# Patient Record
Sex: Female | Born: 1960 | Race: Black or African American | Hispanic: No | Marital: Married | Smoking: Never smoker
Health system: Southern US, Community
[De-identification: ages and names within clinical notes are randomized; demographics above are authoritative.]

## PROBLEM LIST (undated history)

## (undated) DIAGNOSIS — N920 Excessive and frequent menstruation with regular cycle: Secondary | ICD-10-CM

## (undated) DIAGNOSIS — E559 Vitamin D deficiency, unspecified: Secondary | ICD-10-CM

## (undated) DIAGNOSIS — D649 Anemia, unspecified: Secondary | ICD-10-CM

## (undated) DIAGNOSIS — C189 Malignant neoplasm of colon, unspecified: Secondary | ICD-10-CM

## (undated) DIAGNOSIS — D250 Submucous leiomyoma of uterus: Secondary | ICD-10-CM

## (undated) HISTORY — DX: Anemia, unspecified: D64.9

## (undated) HISTORY — DX: Submucous leiomyoma of uterus: D25.0

## (undated) HISTORY — DX: Excessive and frequent menstruation with regular cycle: N92.0

## (undated) HISTORY — DX: Malignant neoplasm of colon, unspecified: C18.9

## (undated) HISTORY — PX: COLON SURGERY: SHX602

## (undated) HISTORY — DX: Vitamin D deficiency, unspecified: E55.9

---

## 1999-11-07 ENCOUNTER — Other Ambulatory Visit: Admission: RE | Admit: 1999-11-07 | Discharge: 1999-11-07 | Payer: Self-pay | Admitting: Obstetrics and Gynecology

## 1999-11-15 ENCOUNTER — Encounter: Payer: Self-pay | Admitting: Obstetrics and Gynecology

## 1999-11-15 ENCOUNTER — Ambulatory Visit (HOSPITAL_COMMUNITY): Admission: RE | Admit: 1999-11-15 | Discharge: 1999-11-15 | Payer: Self-pay | Admitting: Obstetrics and Gynecology

## 2001-01-22 ENCOUNTER — Other Ambulatory Visit: Admission: RE | Admit: 2001-01-22 | Discharge: 2001-01-22 | Payer: Self-pay | Admitting: Obstetrics and Gynecology

## 2002-04-09 ENCOUNTER — Other Ambulatory Visit: Admission: RE | Admit: 2002-04-09 | Discharge: 2002-04-09 | Payer: Self-pay | Admitting: Obstetrics and Gynecology

## 2002-04-16 ENCOUNTER — Ambulatory Visit (HOSPITAL_COMMUNITY): Admission: RE | Admit: 2002-04-16 | Discharge: 2002-04-16 | Payer: Self-pay | Admitting: Obstetrics and Gynecology

## 2002-04-16 ENCOUNTER — Encounter: Payer: Self-pay | Admitting: Obstetrics and Gynecology

## 2003-04-16 ENCOUNTER — Other Ambulatory Visit: Admission: RE | Admit: 2003-04-16 | Discharge: 2003-04-16 | Payer: Self-pay | Admitting: Obstetrics and Gynecology

## 2003-04-24 ENCOUNTER — Ambulatory Visit (HOSPITAL_COMMUNITY): Admission: RE | Admit: 2003-04-24 | Discharge: 2003-04-24 | Payer: Self-pay | Admitting: Obstetrics and Gynecology

## 2004-10-14 ENCOUNTER — Other Ambulatory Visit: Admission: RE | Admit: 2004-10-14 | Discharge: 2004-10-14 | Payer: Self-pay | Admitting: Obstetrics and Gynecology

## 2004-10-21 ENCOUNTER — Ambulatory Visit (HOSPITAL_COMMUNITY): Admission: RE | Admit: 2004-10-21 | Discharge: 2004-10-21 | Payer: Self-pay | Admitting: Obstetrics and Gynecology

## 2005-11-16 ENCOUNTER — Other Ambulatory Visit: Admission: RE | Admit: 2005-11-16 | Discharge: 2005-11-16 | Payer: Self-pay | Admitting: Obstetrics and Gynecology

## 2005-11-24 ENCOUNTER — Ambulatory Visit (HOSPITAL_COMMUNITY): Admission: RE | Admit: 2005-11-24 | Discharge: 2005-11-24 | Payer: Self-pay | Admitting: Obstetrics and Gynecology

## 2009-01-13 ENCOUNTER — Ambulatory Visit (HOSPITAL_COMMUNITY): Admission: RE | Admit: 2009-01-13 | Discharge: 2009-01-13 | Payer: Self-pay | Admitting: Obstetrics and Gynecology

## 2010-01-20 ENCOUNTER — Ambulatory Visit (HOSPITAL_COMMUNITY): Admission: RE | Admit: 2010-01-20 | Discharge: 2010-01-20 | Payer: Self-pay | Admitting: Obstetrics and Gynecology

## 2011-02-13 ENCOUNTER — Other Ambulatory Visit (HOSPITAL_COMMUNITY): Payer: Self-pay | Admitting: Obstetrics and Gynecology

## 2011-02-13 DIAGNOSIS — Z1231 Encounter for screening mammogram for malignant neoplasm of breast: Secondary | ICD-10-CM

## 2011-03-15 ENCOUNTER — Ambulatory Visit (HOSPITAL_COMMUNITY)
Admission: RE | Admit: 2011-03-15 | Discharge: 2011-03-15 | Disposition: A | Discharge status: Home / Self Care | Payer: Managed Care, Other (non HMO) | Source: Ambulatory Visit | Attending: Obstetrics and Gynecology | Admitting: Obstetrics and Gynecology

## 2011-03-15 DIAGNOSIS — Z1231 Encounter for screening mammogram for malignant neoplasm of breast: Secondary | ICD-10-CM | POA: Insufficient documentation

## 2012-01-17 ENCOUNTER — Ambulatory Visit: Payer: Self-pay | Admitting: Obstetrics and Gynecology

## 2012-01-18 ENCOUNTER — Ambulatory Visit (INDEPENDENT_AMBULATORY_CARE_PROVIDER_SITE_OTHER): Payer: Managed Care, Other (non HMO) | Admitting: Obstetrics and Gynecology

## 2012-01-18 ENCOUNTER — Encounter: Payer: Self-pay | Admitting: Obstetrics and Gynecology

## 2012-01-18 VITALS — BP 114/70 | HR 74 | Ht 62.0 in | Wt 217.0 lb

## 2012-01-18 DIAGNOSIS — Z130 Encounter for screening for diseases of the blood and blood-forming organs and certain disorders involving the immune mechanism: Secondary | ICD-10-CM

## 2012-01-18 DIAGNOSIS — E559 Vitamin D deficiency, unspecified: Secondary | ICD-10-CM

## 2012-01-18 DIAGNOSIS — E785 Hyperlipidemia, unspecified: Secondary | ICD-10-CM | POA: Insufficient documentation

## 2012-01-18 DIAGNOSIS — N920 Excessive and frequent menstruation with regular cycle: Secondary | ICD-10-CM

## 2012-01-18 DIAGNOSIS — D259 Leiomyoma of uterus, unspecified: Secondary | ICD-10-CM

## 2012-01-18 DIAGNOSIS — D649 Anemia, unspecified: Secondary | ICD-10-CM

## 2012-01-18 DIAGNOSIS — Z124 Encounter for screening for malignant neoplasm of cervix: Secondary | ICD-10-CM

## 2012-01-18 DIAGNOSIS — Z01419 Encounter for gynecological examination (general) (routine) without abnormal findings: Secondary | ICD-10-CM

## 2012-01-18 DIAGNOSIS — Z1321 Encounter for screening for nutritional disorder: Secondary | ICD-10-CM

## 2012-01-18 DIAGNOSIS — D219 Benign neoplasm of connective and other soft tissue, unspecified: Secondary | ICD-10-CM

## 2012-01-18 LAB — TSH: TSH: 1.431 u[IU]/mL (ref 0.350–4.500)

## 2012-01-18 LAB — CBC
Hemoglobin: 9.2 g/dL — ABNORMAL LOW (ref 12.0–15.0)
MCH: 22.2 pg — ABNORMAL LOW (ref 26.0–34.0)
RBC: 4.14 MIL/uL (ref 3.87–5.11)

## 2012-01-18 NOTE — Progress Notes (Signed)
Regular Periods: yes Mammogram: yes  Monthly Breast Ex.: yes Exercise: yes  Tetanus < 10 years: no Seatbelts: yes  NI. Bladder Functn.: yes Abuse at home: no  Daily BM's: yes Stressful Work: no  Healthy Diet: yes Sigmoid-Colonoscopy: NO  Calcium: no Medical problems this year: HEAVY BLEEDING DURING CYCLE   LAST PAP:10/12  NL  Contraception: NONE  Mammogram:  12/12  NL  PCP: NONE  PMH: NO CHANGE   FMH: NO CHANGE  Last Bone Scan: NO  PT IS MARRIED

## 2012-01-18 NOTE — Progress Notes (Signed)
Subjective:    Ebony James is a 51 y.o. female, G2P0, who presents for an annual exam. The patient reports heavier periods, craving of ice and fatigue.  Menstrual cycle:   LMP: Patient's last menstrual period was 12/25/2011.  Flow 9 days with pad change every 45 minutes-1 hour with clots; admits to mild cramping; denies intermenstrual bleeding.             Review of Systems Pertinent items are noted in HPI. Denies pelvic pain, urinary tract symptoms, vaginitis symptoms, irregular bleeding, menopausal symptoms, change in bowel habits or rectal bleeding   Objective:    BP 114/70  Pulse 74  Ht 5\' 2"  (1.575 m)  Wt 217 lb (98.431 kg)  BMI 39.69 kg/m2  LMP 12/25/2011   Wt Readings from Last 1 Encounters:  01/18/12 217 lb (98.431 kg)   Body mass index is 39.69 kg/(m^2). General Appearance: Alert, no acute distress HEENT: Grossly normal Neck / Thyroid: Supple, no thyromegaly or cervical adenopathy Lungs: Clear to auscultation bilaterally Back: No CVA tenderness Breast Exam: No masses or nodes.No dimpling, nipple retraction or discharge. Cardiovascular: Regular rate and rhythm.  Gastrointestinal: Soft, non-tender, no masses or organomegaly Pelvic Exam: EGBUS-wnl, vagina-normal rugae, cervix- without lesions or tenderness, uterus appears 12-14 weeks size, adnexae-no masses or tenderness Rectovaginal: deferred due to hemorrhoids Lymphatic Exam: Non-palpable nodes in neck, clavicular,  axillary, or inguinal regions  Skin: no rashes or abnormalities Extremities: no clubbing cyanosis or edema  Neurologic: grossly normal Psychiatric: Alert and oriented    Assessment:   Routine GYN Exam Menorrhagia Uterine Fibroids   Plan:  CBC, TSH, Vitamin D and pelvic ultrasound-pending  PAP sent  RTO 1 year or prn  Esmond Hinch,ELMIRAPA-C

## 2012-01-19 ENCOUNTER — Telehealth: Payer: Self-pay

## 2012-01-19 LAB — PAP IG W/ RFLX HPV ASCU

## 2012-01-19 MED ORDER — VITAMIN D (ERGOCALCIFEROL) 1.25 MG (50000 UNIT) PO CAPS: ORAL_CAPSULE | ORAL | Status: DC

## 2012-01-19 NOTE — Telephone Encounter (Signed)
Message copied by Winfred Leeds on Fri Jan 19, 2012  2:54 PM ------      Message from: Henreitta Leber      Created: Fri Jan 19, 2012  1:20 PM       Patient with low iron and vitamin D needs Vitamin D Protocol and should be advised to take OTC iron supplement, twice a day for 6 weeks.  We should repeat her CBC in 6 weeks to make sure that her iron has normalized.  If patient has problems with constipation, she may take stool softeners with her iron.       POWELL,ELMIRA, PA-C

## 2012-01-19 NOTE — Telephone Encounter (Signed)
TC TO PT REGARDING TEST RESULTS. INFORMED PT THAT HER VIT D LEVEL WAS LOW AND WENT OVER PROTOCOL WITH PT. WILL SEND IN RX TO PT PHARMACY. ALSO INFORMED PT THAT HER IRON LEVEL WAS LOW AND SHE NEEDS TO TAKE A OTC IRON SUPPLEMENT 2 TIMES A DAY FOR 6 WEEKS TO GET IRON LEVEL UP THEN I WILL PUT PT ON RECALL LIST FOR BOTH THE CBC AND VIT D LEVEL TO RECHECK. PT VOICED UNDERSTANDING. TOLD PT TO USE A STOOL SOFTENER WITH  THE IRON TABLETS TO AVOID CONSTIPATION. PT VOICED UNDERSTANDING.

## 2012-01-31 ENCOUNTER — Ambulatory Visit (INDEPENDENT_AMBULATORY_CARE_PROVIDER_SITE_OTHER): Payer: Managed Care, Other (non HMO)

## 2012-01-31 ENCOUNTER — Encounter: Payer: Self-pay | Admitting: Obstetrics and Gynecology

## 2012-01-31 ENCOUNTER — Ambulatory Visit (INDEPENDENT_AMBULATORY_CARE_PROVIDER_SITE_OTHER): Payer: Managed Care, Other (non HMO) | Admitting: Obstetrics and Gynecology

## 2012-01-31 VITALS — BP 118/80 | HR 78 | Wt 218.0 lb

## 2012-01-31 DIAGNOSIS — D649 Anemia, unspecified: Secondary | ICD-10-CM

## 2012-01-31 DIAGNOSIS — D219 Benign neoplasm of connective and other soft tissue, unspecified: Secondary | ICD-10-CM | POA: Insufficient documentation

## 2012-01-31 DIAGNOSIS — D259 Leiomyoma of uterus, unspecified: Secondary | ICD-10-CM

## 2012-01-31 DIAGNOSIS — N920 Excessive and frequent menstruation with regular cycle: Secondary | ICD-10-CM | POA: Insufficient documentation

## 2012-01-31 MED ORDER — TRANEXAMIC ACID 650 MG PO TABS: 1300.0000 mg | ORAL_TABLET | Freq: Three times a day (TID) | ORAL | Status: DC

## 2012-01-31 NOTE — Patient Instructions (Addendum)
Fibroids Fibroids are lumps (tumors) that can occur any place in a woman's body. These lumps are not cancerous. Fibroids vary in size, weight, and where they grow. HOME CARE  Do not take aspirin.  Write down the number of pads or tampons you use during your period. Tell your doctor. This can help determine the best treatment for you. GET HELP RIGHT AWAY IF:  You have pain in your lower belly (abdomen) that is not helped with medicine.  You have cramps that are not helped with medicine.  You have more bleeding between or during your period.  You feel lightheaded or pass out (faint).  Your lower belly pain gets worse. MAKE SURE YOU:  Understand these instructions.  Will watch your condition.  Will get help right away if you are not doing well or get worse. Document Released: 04/29/2010 Document Revised: 06/19/2011 Document Reviewed: 04/29/2010 [facility] Patient Information 2013 [place], [place].   Menorrhagia Dysfunctional uterine bleeding is different from a normal menstrual period. When periods are heavy or there is more bleeding than is usual for you, it is called menorrhagia. It may be caused by hormonal imbalance, or physical, metabolic, or other problems. Examination is necessary in order that your caregiver may treat treatable causes. If this is a continuing problem, a D&C may be needed. That means that the cervix (the opening of the uterus or womb) is dilated (stretched larger) and the lining of the uterus is scraped out. The tissue scraped out is then examined under a microscope by a specialist (pathologist) to make sure there is nothing of concern that needs further or more extensive treatment. HOME CARE INSTRUCTIONS   If medications were prescribed, take exactly as directed. Do not change or switch medications without consulting your caregiver.  Long term heavy bleeding may result in iron deficiency. Your caregiver may have prescribed iron pills. They help replace the iron  your body lost from heavy bleeding. Take exactly as directed. Iron may cause constipation. If this becomes a problem, increase the bran, fruits, and roughage in your diet.  Do not take aspirin or medicines that contain aspirin one week before or during your menstrual period. Aspirin may make the bleeding worse.  If you need to change your sanitary pad or tampon more than once every 2 hours, stay in bed and rest as much as possible until the bleeding stops.  Eat well-balanced meals. Eat foods high in iron. Examples are leafy green vegetables, meat, liver, eggs, and whole grain breads and cereals. Do not try to lose weight until the abnormal bleeding has stopped and your blood iron level is back to normal. SEEK MEDICAL CARE IF:   You need to change your sanitary pad or tampon more than once an hour.  You develop nausea (feeling sick to your stomach) and vomiting, dizziness, or diarrhea while you are taking your medicine.  You have any problems that may be related to the medicine you are taking. SEEK IMMEDIATE MEDICAL CARE IF:   You have a fever.  You develop chills.  You develop severe bleeding or start to pass blood clots.  You feel dizzy or faint. MAKE SURE YOU:   Understand these instructions.  Will watch your condition.  Will get help right away if you are not doing well or get worse. Document Released: 03/27/2005 Document Revised: 06/19/2011 Document Reviewed: 11/15/2007 [facility] Patient Information 2013 [place], [place].  [email or web address]  Options for management: birth control pills/patch/ring;  Mirena IUD, Lysteda,  endometrial ablation, myomectomy, uterine artery  embolization and hysterectomy

## 2012-01-31 NOTE — Progress Notes (Signed)
51 YO with menorrhagia (no cramps)  and anemia, returns for an ultrasound.  Has been taking iron supplements twice a day with Colace.  O: Ultrasound: 10.18 x 7.13 x 7.01 cm, endometrium-7.57 mm;   #4 fibroids: 3.42 x 3.32 x 3.75 cm;  3.47 x 3.69 x 3.87 cm; and 1.99 x 2.92 x 2.56 cm      with normal appearing ovaries  A: Menorrhagia     Anemia (Hgb 9.2)    Uterine Fibroids   P: Reviewed fibroids and management options for menorrhagia: hormonal, Mirena IUD, Lysteda,     endometrial ablation, myomectomy and hysterectomy      Patient wants to try Lysteda 650 mg  #30  2 po tid x 5 days as needed 11 refills      Given information on ablation (Novasure) as patient has some interest in this procedure       RTO-as scheduled or prn   Bexley Laubach, PA-C

## 2012-02-15 ENCOUNTER — Other Ambulatory Visit: Payer: Self-pay | Admitting: Obstetrics and Gynecology

## 2012-02-15 DIAGNOSIS — Z1231 Encounter for screening mammogram for malignant neoplasm of breast: Secondary | ICD-10-CM

## 2012-03-15 ENCOUNTER — Ambulatory Visit (HOSPITAL_COMMUNITY): Payer: Managed Care, Other (non HMO) | Attending: Obstetrics and Gynecology

## 2012-12-16 ENCOUNTER — Ambulatory Visit (INDEPENDENT_AMBULATORY_CARE_PROVIDER_SITE_OTHER): Payer: Managed Care, Other (non HMO) | Admitting: General Surgery

## 2012-12-16 ENCOUNTER — Encounter (INDEPENDENT_AMBULATORY_CARE_PROVIDER_SITE_OTHER): Payer: Self-pay | Admitting: General Surgery

## 2012-12-16 VITALS — BP 112/66 | HR 95 | Temp 97.3°F | Ht 63.0 in | Wt 211.2 lb

## 2012-12-16 DIAGNOSIS — D126 Benign neoplasm of colon, unspecified: Secondary | ICD-10-CM

## 2012-12-16 NOTE — Patient Instructions (Signed)
Plan for laparoscopic assisted right colectomy

## 2012-12-16 NOTE — Progress Notes (Signed)
Patient ID: Ebony James, female   DOB: 12/16/1960, 52 y.o.   MRN: [id number]  Chief Complaint  Patient presents with  . New Evaluation    eval cecal mass    HPI Ebony VEITH is a 52 y.o. female.  We are asked to see the patient in consultation by Dr. Madilyn Fireman to evaluate her for a right colon mass. The patient is a 52 year old black female who recently went for her first routine screening colonoscopy. At that time she was found to have a 4 cm mass in the right colon. This was biopsied and came back as a tubular adenoma. No dysplasia or malignancy was identified. She denied any abdominal pain. She denied any recent weight gain or weight loss. She denied any blood in her stool.  HPI  Past Medical History  Diagnosis Date  . Menorrhagia   . Fibroids, submucosal   . Anemia   . Vitamin D deficiency     H/O    Past Surgical History  Procedure Laterality Date  . Cesarean section  1987    Family History  Problem Relation Age of Onset  . Cancer Mother     CERVICAL  . Cancer Sister     BREAST    Social History History  Substance Use Topics  . Smoking status: Never Smoker   . Smokeless tobacco: Never Used  . Alcohol Use: Yes     Comment: occassional    No Known Allergies  Current Outpatient Prescriptions  Medication Sig Dispense Refill  . Iron-Vit C-Vit B12-Folic Acid (IRON 100 PLUS PO) Take by mouth.      . tranexamic acid (LYSTEDA) 650 MG TABS Take 2 tablets (1,300 mg total) by mouth 3 (three) times daily.  30 tablet  11  . Vitamin D, Ergocalciferol, (DRISDOL) 50000 UNITS CAPS TAKE ONE CAPSULE 1 TIME A WEEK FOR 12 WEEKS  20 capsule  0   No current facility-administered medications for this visit.    Review of Systems Review of Systems  Constitutional: Negative.   HENT: Negative.   Eyes: Negative.   Respiratory: Negative.   Cardiovascular: Negative.   Gastrointestinal: Negative.   Endocrine: Negative.   Genitourinary: Negative.   Musculoskeletal: Negative.    Skin: Negative.   Allergic/Immunologic: Negative.   Neurological: Negative.   Hematological: Negative.   Psychiatric/Behavioral: Negative.     Blood pressure 112/66, pulse 95, temperature 97.3 F (36.3 C), temperature source Temporal, height 5\' 3"  (1.6 m), weight 211 lb 3.2 oz (95.8 kg), SpO2 98.00%.  Physical Exam Physical Exam  Constitutional: She is oriented to person, place, and time. She appears well-developed and well-nourished.  HENT:  Head: Normocephalic and atraumatic.  Eyes: Conjunctivae and EOM are normal. Pupils are equal, round, and reactive to light.  Neck: Normal range of motion. Neck supple.  Cardiovascular: Normal rate, regular rhythm and normal heart sounds.   Pulmonary/Chest: Effort normal and breath sounds normal.  Abdominal: Soft. Bowel sounds are normal. She exhibits no mass. There is no tenderness.  Musculoskeletal: Normal range of motion.  Neurological: She is alert and oriented to person, place, and time.  Skin: Skin is warm and dry.  Psychiatric: She has a normal mood and affect. Her behavior is normal.    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 Unfortunately if this is left alone there is some risk that he  could turn into a colon cancer. Because of this the recommendation would be to have this segment of her colon removed. I think she would be a good candidate for a laparoscopic-assisted type of case. I have discussed with her in detail the risks and benefits of the operation to remove this segment of colon as well as some of the technical aspects including the risk of leak and she understands and wishes to proceed. She does seem a little bit surprised  About the need for surgery and would like to talk to her family before scheduling.     Plan    Plan for laparoscopic-assisted right colectomy. I will be happy to talk to her and  her family again if they have any other questions.        TOTH III,Carri Spillers S 12/16/2012, 2:11 PM

## 2012-12-24 ENCOUNTER — Encounter (INDEPENDENT_AMBULATORY_CARE_PROVIDER_SITE_OTHER): Payer: Self-pay

## 2012-12-26 ENCOUNTER — Other Ambulatory Visit (INDEPENDENT_AMBULATORY_CARE_PROVIDER_SITE_OTHER): Payer: Self-pay | Admitting: General Surgery

## 2013-01-20 ENCOUNTER — Encounter (HOSPITAL_COMMUNITY): Payer: Self-pay | Admitting: Pharmacy Technician

## 2013-01-22 NOTE — Pre-Procedure Instructions (Signed)
Ebony James  01/22/2013   Your procedure is scheduled on:  Friday, October 24th  Report to Main Entrance "A" and check in with admitting at 0530 AM.  Call this number if you have problems the morning of surgery: [phone]   Remember:   Do not eat food or drink liquids after midnight.   Take these medicines the morning of surgery with A SIP OF WATER: none   Do not wear jewelry, make-up or nail polish.  Do not wear lotions, powders, or perfumes. You may wear deodorant.  Do not shave 48 hours prior to surgery. Men may shave face and neck.  Do not bring valuables to the hospital.  [facility] is not responsible  for any belongings or valuables.               Contacts, dentures or bridgework may not be worn into surgery.  Leave suitcase in the car. After surgery it may be brought to your room.  For patients admitted to the hospital, discharge time is determined by your treatment team.               Patients discharged the day of surgery will not be allowed to drive home.   Special Instructions: Shower using CHG 2 nights before surgery and the night before surgery.  If you shower the day of surgery use CHG.  Use special wash - you have one bottle of CHG for all showers.  You should use approximately 1/3 of the bottle for each shower.   Please read over the following fact sheets that you were given: Pain Booklet, Coughing and Deep Breathing, Blood Transfusion Information and Surgical Site Infection Prevention

## 2013-01-23 ENCOUNTER — Encounter (HOSPITAL_COMMUNITY)
Admission: RE | Admit: 2013-01-23 | Discharge: 2013-01-23 | Disposition: A | Discharge status: Home / Self Care | Payer: Managed Care, Other (non HMO) | Source: Ambulatory Visit | Attending: General Surgery | Admitting: General Surgery

## 2013-01-23 ENCOUNTER — Telehealth (INDEPENDENT_AMBULATORY_CARE_PROVIDER_SITE_OTHER): Payer: Self-pay

## 2013-01-23 DIAGNOSIS — Z01812 Encounter for preprocedural laboratory examination: Secondary | ICD-10-CM | POA: Insufficient documentation

## 2013-01-23 LAB — BASIC METABOLIC PANEL
BUN: 14 mg/dL (ref 6–23)
Chloride: 105 mEq/L (ref 96–112)
GFR calc Af Amer: >90 mL/min (ref >90)
Potassium: 3.7 mEq/L (ref 3.5–5.1)
Sodium: 140 mEq/L (ref 135–145)

## 2013-01-23 LAB — CBC
Hemoglobin: 8.6 g/dL — ABNORMAL LOW (ref 12.0–15.0)
MCH: 22.3 pg — ABNORMAL LOW (ref 26.0–34.0)
MCHC: 29.5 g/dL — ABNORMAL LOW (ref 30.0–36.0)
MCV: 75.6 fL — ABNORMAL LOW (ref 78.0–100.0)
Platelets: 365 10*3/uL (ref 150–400)
RBC: 3.86 MIL/uL — ABNORMAL LOW (ref 3.87–5.11)
RDW: 18.6 % — ABNORMAL HIGH (ref 11.5–15.5)
WBC: 6.6 10*3/uL (ref 4.0–10.5)

## 2013-01-23 LAB — ABO/RH: ABO/RH(D): B POS

## 2013-01-23 NOTE — Progress Notes (Signed)
Anesthesia Chart Review:  Patient is a 52 year old female scheduled for laparoscopic assisted right colectomy for a large 4 cm adenoma in the cecum on 01/31/13 by Dr. Carolynne Edouard. History includes obesity, non-smoker, fibroids, anemia, vitamin D deficiency. c-section.  PCP is listed as Dr. Alesia Richards Nnodi. GI is Dr. Madilyn Fireman.   Preoperative labs noted.  CBC results routed to Dr. Carolynne Edouard.  H/H 8.6/29.2.  (Currently, the only comparison labs are from 01/18/12 that show an H/H of 9.2/32.4.  She has a known history of anemia. PCP records are pending.)  PLT 365K.  T&S was done.  I'll change it to a T&C for 2 Units of PRBC to have available if needed.     Velna [facility] Short Stay Center/Anesthesiology Phone [phone] 01/23/2013 5:30 PM

## 2013-01-23 NOTE — Telephone Encounter (Signed)
Pt came in and given prep and instructions.

## 2013-01-23 NOTE — Telephone Encounter (Signed)
[name] at short stay called stating pt has not received one day bowel prep that is in Dr Billey Chang surgical orders. I advised her to have pt come to office today for rx and prep instructions. Marcelino Duster aware pt will be coming.

## 2013-01-27 ENCOUNTER — Telehealth (INDEPENDENT_AMBULATORY_CARE_PROVIDER_SITE_OTHER): Payer: Self-pay

## 2013-01-27 NOTE — Telephone Encounter (Signed)
Patient was unsure of colon prep. I ask if she received any information on colon prep with the Gatorade, mirilax and Doculax , patient replied yes I advised her to pick it up at her RX and mix and drink  as directed if she had any questions to call. Patient verbalized understanding

## 2013-01-30 MED ORDER — DEXTROSE 5 % IV SOLN
2.0000 g | INTRAVENOUS | Status: AC
  Administered 2013-01-31: 2 g via INTRAVENOUS
  Filled 2013-01-30: qty 2

## 2013-01-31 ENCOUNTER — Inpatient Hospital Stay (HOSPITAL_COMMUNITY): Payer: Managed Care, Other (non HMO) | Admitting: Anesthesiology

## 2013-01-31 ENCOUNTER — Inpatient Hospital Stay (HOSPITAL_COMMUNITY)
Admission: RE | Admit: 2013-01-31 | Discharge: 2013-02-04 | DRG: 331 | Disposition: A | Discharge status: Home / Self Care | Payer: Managed Care, Other (non HMO) | Source: Ambulatory Visit | Attending: General Surgery | Admitting: General Surgery

## 2013-01-31 ENCOUNTER — Encounter (HOSPITAL_COMMUNITY)
Admission: RE | Disposition: A | Discharge status: Home / Self Care | Payer: Self-pay | Source: Ambulatory Visit | Attending: General Surgery

## 2013-01-31 ENCOUNTER — Encounter (HOSPITAL_COMMUNITY): Payer: Managed Care, Other (non HMO) | Admitting: Vascular Surgery

## 2013-01-31 ENCOUNTER — Encounter (HOSPITAL_COMMUNITY): Payer: Self-pay | Admitting: Anesthesiology

## 2013-01-31 DIAGNOSIS — Z23 Encounter for immunization: Secondary | ICD-10-CM

## 2013-01-31 DIAGNOSIS — E669 Obesity, unspecified: Secondary | ICD-10-CM | POA: Diagnosis present

## 2013-01-31 DIAGNOSIS — C189 Malignant neoplasm of colon, unspecified: Secondary | ICD-10-CM

## 2013-01-31 DIAGNOSIS — D126 Benign neoplasm of colon, unspecified: Principal | ICD-10-CM | POA: Diagnosis present

## 2013-01-31 HISTORY — PX: LAPAROSCOPIC RIGHT COLECTOMY: SHX5925

## 2013-01-31 SURGERY — COLECTOMY, RIGHT, LAPAROSCOPIC
Anesthesia: General | Site: Abdomen | Laterality: Right | Wound class: Clean Contaminated

## 2013-01-31 MED ORDER — ALVIMOPAN 12 MG PO CAPS
12.0000 mg | ORAL_CAPSULE | Freq: Once | ORAL | Status: AC
  Administered 2013-01-31: 12 mg via ORAL
  Filled 2013-01-31: qty 1

## 2013-01-31 MED ORDER — CHLORHEXIDINE GLUCONATE 4 % EX LIQD: 1.0000 "application " | Freq: Once | CUTANEOUS | Status: DC

## 2013-01-31 MED ORDER — PANTOPRAZOLE SODIUM 40 MG IV SOLR
40.0000 mg | INTRAVENOUS | Status: DC
  Administered 2013-01-31 – 2013-02-03 (×4): 40 mg via INTRAVENOUS
  Filled 2013-01-31 (×6): qty 40

## 2013-01-31 MED ORDER — POVIDONE-IODINE 10 % EX OINT
TOPICAL_OINTMENT | CUTANEOUS | Status: DC | PRN
  Administered 2013-01-31: 1 via TOPICAL

## 2013-01-31 MED ORDER — HYDROMORPHONE HCL PF 1 MG/ML IJ SOLN
INTRAMUSCULAR | Status: AC
  Filled 2013-01-31: qty 1

## 2013-01-31 MED ORDER — HEPARIN SODIUM (PORCINE) 5000 UNIT/ML IJ SOLN
5000.0000 [IU] | Freq: Three times a day (TID) | INTRAMUSCULAR | Status: DC
  Administered 2013-02-01 – 2013-02-04 (×10): 5000 [IU] via SUBCUTANEOUS
  Filled 2013-01-31 (×14): qty 1

## 2013-01-31 MED ORDER — ROCURONIUM BROMIDE 100 MG/10ML IV SOLN
INTRAVENOUS | Status: DC | PRN
  Administered 2013-01-31: 50 mg via INTRAVENOUS
  Administered 2013-01-31: 10 mg via INTRAVENOUS
  Administered 2013-01-31: 20 mg via INTRAVENOUS

## 2013-01-31 MED ORDER — BUPIVACAINE-EPINEPHRINE PF 0.25-1:200000 % IJ SOLN
INTRAMUSCULAR | Status: AC
  Filled 2013-01-31: qty 30

## 2013-01-31 MED ORDER — OXYCODONE HCL 5 MG PO TABS: 5.0000 mg | ORAL_TABLET | Freq: Once | ORAL | Status: AC | PRN

## 2013-01-31 MED ORDER — SODIUM CHLORIDE 0.9 % IR SOLN
Status: DC | PRN
  Administered 2013-01-31: 1000 mL
  Administered 2013-01-31: 3000 mL

## 2013-01-31 MED ORDER — OXYCODONE HCL 5 MG/5ML PO SOLN
5.0000 mg | Freq: Once | ORAL | Status: AC | PRN
  Administered 2013-01-31: 5 mg via ORAL

## 2013-01-31 MED ORDER — ONDANSETRON HCL 4 MG/2ML IJ SOLN: 4.0000 mg | Freq: Once | INTRAMUSCULAR | Status: DC | PRN

## 2013-01-31 MED ORDER — OXYCODONE HCL 5 MG/5ML PO SOLN
ORAL | Status: AC
  Filled 2013-01-31: qty 5

## 2013-01-31 MED ORDER — POVIDONE-IODINE 10 % EX OINT
TOPICAL_OINTMENT | CUTANEOUS | Status: AC
  Filled 2013-01-31: qty 28.35

## 2013-01-31 MED ORDER — PROPOFOL 10 MG/ML IV BOLUS
INTRAVENOUS | Status: DC | PRN
  Administered 2013-01-31: 150 mg via INTRAVENOUS
  Administered 2013-01-31: 50 mg via INTRAVENOUS

## 2013-01-31 MED ORDER — FENTANYL CITRATE 0.05 MG/ML IJ SOLN
INTRAMUSCULAR | Status: DC | PRN
  Administered 2013-01-31 (×2): 100 ug via INTRAVENOUS
  Administered 2013-01-31: 50 ug via INTRAVENOUS
  Administered 2013-01-31 (×2): 100 ug via INTRAVENOUS
  Administered 2013-01-31: 50 ug via INTRAVENOUS

## 2013-01-31 MED ORDER — HYDROMORPHONE HCL PF 1 MG/ML IJ SOLN
0.2500 mg | INTRAMUSCULAR | Status: DC | PRN
  Administered 2013-01-31 (×3): 0.5 mg via INTRAVENOUS

## 2013-01-31 MED ORDER — NEOSTIGMINE METHYLSULFATE 1 MG/ML IJ SOLN
INTRAMUSCULAR | Status: DC | PRN
  Administered 2013-01-31: 5 mg via INTRAVENOUS

## 2013-01-31 MED ORDER — KCL IN DEXTROSE-NACL 20-5-0.9 MEQ/L-%-% IV SOLN
INTRAVENOUS | Status: DC
  Administered 2013-01-31 – 2013-02-02 (×4): via INTRAVENOUS
  Filled 2013-01-31 (×7): qty 1000

## 2013-01-31 MED ORDER — MIDAZOLAM HCL 5 MG/5ML IJ SOLN
INTRAMUSCULAR | Status: DC | PRN
  Administered 2013-01-31: 2 mg via INTRAVENOUS

## 2013-01-31 MED ORDER — ONDANSETRON HCL 4 MG PO TABS: 4.0000 mg | ORAL_TABLET | Freq: Four times a day (QID) | ORAL | Status: DC | PRN

## 2013-01-31 MED ORDER — BUPIVACAINE-EPINEPHRINE 0.25% -1:200000 IJ SOLN
INTRAMUSCULAR | Status: DC | PRN
  Administered 2013-01-31: 20 mL

## 2013-01-31 MED ORDER — MEPERIDINE HCL 25 MG/ML IJ SOLN: 6.2500 mg | INTRAMUSCULAR | Status: DC | PRN

## 2013-01-31 MED ORDER — ONDANSETRON HCL 4 MG/2ML IJ SOLN
4.0000 mg | Freq: Four times a day (QID) | INTRAMUSCULAR | Status: DC | PRN
  Administered 2013-01-31: 4 mg via INTRAVENOUS
  Filled 2013-01-31: qty 2

## 2013-01-31 MED ORDER — INFLUENZA VAC SPLIT QUAD 0.5 ML IM SUSP
0.5000 mL | INTRAMUSCULAR | Status: AC
  Administered 2013-02-01: 0.5 mL via INTRAMUSCULAR
  Filled 2013-01-31: qty 0.5

## 2013-01-31 MED ORDER — MORPHINE SULFATE 4 MG/ML IJ SOLN
4.0000 mg | INTRAMUSCULAR | Status: DC | PRN
Start: 2013-01-31 — End: 2013-02-04
  Administered 2013-01-31 – 2013-02-02 (×15): 4 mg via INTRAVENOUS
  Administered 2013-02-02: 2 mg via INTRAVENOUS
  Administered 2013-02-02: 4 mg via INTRAVENOUS
  Administered 2013-02-02: 2 mg via INTRAVENOUS
  Administered 2013-02-03: 4 mg via INTRAVENOUS
  Filled 2013-01-31 (×19): qty 1

## 2013-01-31 MED ORDER — LIDOCAINE HCL (CARDIAC) 20 MG/ML IV SOLN
INTRAVENOUS | Status: DC | PRN
  Administered 2013-01-31: 100 mg via INTRAVENOUS

## 2013-01-31 MED ORDER — LACTATED RINGERS IV SOLN
INTRAVENOUS | Status: DC | PRN
  Administered 2013-01-31 (×3): via INTRAVENOUS

## 2013-01-31 MED ORDER — ALVIMOPAN 12 MG PO CAPS
12.0000 mg | ORAL_CAPSULE | Freq: Two times a day (BID) | ORAL | Status: DC
  Administered 2013-02-01 – 2013-02-04 (×7): 12 mg via ORAL
  Filled 2013-01-31 (×11): qty 1

## 2013-01-31 MED ORDER — GLYCOPYRROLATE 0.2 MG/ML IJ SOLN
INTRAMUSCULAR | Status: DC | PRN
  Administered 2013-01-31: .5 mg via INTRAVENOUS

## 2013-01-31 SURGICAL SUPPLY — 75 items
ADH SKN CLS APL DERMABOND .7 (GAUZE/BANDAGES/DRESSINGS)
APPLIER CLIP ROT 10 11.4 M/L (STAPLE)
APR CLP MED LRG 11.4X10 (STAPLE)
BLADE SURG 10 STRL SS (BLADE) ×2 IMPLANT
BLADE SURG ROTATE 9660 (MISCELLANEOUS) IMPLANT
CANISTER SUCTION 2500CC (MISCELLANEOUS) ×2 IMPLANT
CELLS DAT CNTRL 66122 CELL SVR (MISCELLANEOUS) ×1 IMPLANT
CHLORAPREP W/TINT 26ML (MISCELLANEOUS) ×2 IMPLANT
CLIP APPLIE ROT 10 11.4 M/L (STAPLE) IMPLANT
COVER SURGICAL LIGHT HANDLE (MISCELLANEOUS) ×3 IMPLANT
DECANTER SPIKE VIAL GLASS SM (MISCELLANEOUS) ×2 IMPLANT
DERMABOND ADVANCED (GAUZE/BANDAGES/DRESSINGS)
DERMABOND ADVANCED .7 DNX12 (GAUZE/BANDAGES/DRESSINGS) IMPLANT
DRAPE PROXIMA HALF (DRAPES) ×2 IMPLANT
DRAPE UTILITY 15X26 W/TAPE STR (DRAPE) ×4 IMPLANT
DRAPE WARM FLUID 44X44 (DRAPE) ×2 IMPLANT
DRSG OPSITE POSTOP 4X8 (GAUZE/BANDAGES/DRESSINGS) ×1 IMPLANT
ELECT CAUTERY BLADE 6.4 (BLADE) ×4 IMPLANT
ELECT REM PT RETURN 9FT ADLT (ELECTROSURGICAL) ×2
ELECTRODE REM PT RTRN 9FT ADLT (ELECTROSURGICAL) ×1 IMPLANT
GAUZE SPONGE 2X2 8PLY STRL LF (GAUZE/BANDAGES/DRESSINGS) IMPLANT
GLOVE BIO SURGEON STRL SZ 6.5 (GLOVE) ×4 IMPLANT
GLOVE BIO SURGEON STRL SZ7.5 (GLOVE) ×5 IMPLANT
GLOVE BIOGEL PI IND STRL 6.5 (GLOVE) IMPLANT
GLOVE BIOGEL PI IND STRL 7.5 (GLOVE) ×3 IMPLANT
GLOVE BIOGEL PI IND STRL 8 (GLOVE) ×2 IMPLANT
GLOVE BIOGEL PI INDICATOR 6.5 (GLOVE) ×4
GLOVE BIOGEL PI INDICATOR 7.5 (GLOVE) ×3
GLOVE BIOGEL PI INDICATOR 8 (GLOVE) ×2
GLOVE ECLIPSE 6.0 STRL STRAW (GLOVE) ×4 IMPLANT
GOWN STRL NON-REIN LRG LVL3 (GOWN DISPOSABLE) ×11 IMPLANT
GOWN STRL REIN XL XLG (GOWN DISPOSABLE) ×2 IMPLANT
KIT BASIN OR (CUSTOM PROCEDURE TRAY) ×2 IMPLANT
KIT ROOM TURNOVER OR (KITS) ×2 IMPLANT
LIGASURE IMPACT 36 18CM CVD LR (INSTRUMENTS) ×2 IMPLANT
NS IRRIG 1000ML POUR BTL (IV SOLUTION) ×4 IMPLANT
PAD ARMBOARD 7.5X6 YLW CONV (MISCELLANEOUS) ×4 IMPLANT
PENCIL BUTTON HOLSTER BLD 10FT (ELECTRODE) ×2 IMPLANT
RELOAD PROXIMATE 75MM BLUE (ENDOMECHANICALS) ×2 IMPLANT
RELOAD STAPLE 75 3.8 BLU REG (ENDOMECHANICALS) IMPLANT
RTRCTR WOUND ALEXIS 18CM MED (MISCELLANEOUS) ×2
SCALPEL HARMONIC ACE (MISCELLANEOUS) IMPLANT
SCISSORS LAP 5X35 DISP (ENDOMECHANICALS) IMPLANT
SET IRRIG TUBING LAPAROSCOPIC (IRRIGATION / IRRIGATOR) ×2 IMPLANT
SLEEVE ENDOPATH XCEL 5M (ENDOMECHANICALS) ×6 IMPLANT
SPECIMEN JAR LARGE (MISCELLANEOUS) ×2 IMPLANT
SPONGE GAUZE 2X2 STER 10/PKG (GAUZE/BANDAGES/DRESSINGS) ×1
SPONGE GAUZE 4X4 12PLY (GAUZE/BANDAGES/DRESSINGS) ×2 IMPLANT
STAPLER GUN LINEAR PROX 60 (STAPLE) ×2 IMPLANT
STAPLER PROXIMATE 75MM BLUE (STAPLE) ×1 IMPLANT
STAPLER VISISTAT 35W (STAPLE) ×2 IMPLANT
SUCTION POOLE TIP (SUCTIONS) ×1 IMPLANT
SUT PDS AB 1 CT  36 (SUTURE) ×2
SUT PDS AB 1 CT 36 (SUTURE) ×2 IMPLANT
SUT PDS AB 1 TP1 96 (SUTURE) ×2 IMPLANT
SUT SILK 2 0 (SUTURE) ×2
SUT SILK 2 0 SH CR/8 (SUTURE) ×3 IMPLANT
SUT SILK 2-0 18XBRD TIE 12 (SUTURE) ×1 IMPLANT
SUT SILK 3 0 (SUTURE) ×2
SUT SILK 3 0 SH CR/8 (SUTURE) ×2 IMPLANT
SUT SILK 3-0 18XBRD TIE 12 (SUTURE) ×1 IMPLANT
SYR BULB IRRIGATION 50ML (SYRINGE) ×1 IMPLANT
SYS LAPSCP GELPORT 120MM (MISCELLANEOUS)
SYSTEM LAPSCP GELPORT 120MM (MISCELLANEOUS) IMPLANT
TOWEL OR 17X24 6PK STRL BLUE (TOWEL DISPOSABLE) ×2 IMPLANT
TOWEL OR 17X26 10 PK STRL BLUE (TOWEL DISPOSABLE) ×2 IMPLANT
TRAY FOLEY CATH 14FRSI W/METER (CATHETERS) ×2 IMPLANT
TRAY LAPAROSCOPIC (CUSTOM PROCEDURE TRAY) ×2 IMPLANT
TROCAR XCEL 12X100 BLDLESS (ENDOMECHANICALS) IMPLANT
TROCAR XCEL BLUNT TIP 100MML (ENDOMECHANICALS) ×2 IMPLANT
TROCAR XCEL NON-BLD 11X100MML (ENDOMECHANICALS) IMPLANT
TROCAR XCEL NON-BLD 5MMX100MML (ENDOMECHANICALS) ×2 IMPLANT
TUBE CONNECTING 12X1/4 (SUCTIONS) ×2 IMPLANT
WATER STERILE IRR 1000ML POUR (IV SOLUTION) IMPLANT
YANKAUER SUCT BULB TIP NO VENT (SUCTIONS) ×4 IMPLANT

## 2013-01-31 NOTE — Op Note (Signed)
01/31/2013  9:47 AM  PATIENT:  Ebony James  52 y.o. female  PRE-OPERATIVE DIAGNOSIS:  right colon polyp  POST-OPERATIVE DIAGNOSIS:  right colon polyp  PROCEDURE:  Procedure(s): LAPAROSCOPIC ASSISTED RIGHT COLECTOMY (Right)  SURGEON:  Surgeon(s) and Role:    * Robyne Askew, MD - Primary  PHYSICIAN ASSISTANT:   ASSISTANTS: Dr. Michaell Cowing   ANESTHESIA:   general  EBL:  Total I/O In: 2000 [I.V.:2000] Out: 200 [Urine:200]  BLOOD ADMINISTERED:none  DRAINS: none   LOCAL MEDICATIONS USED:  MARCAINE     SPECIMEN:  Source of Specimen:  right colon and terminal ileum  DISPOSITION OF SPECIMEN:  PATHOLOGY  COUNTS:  YES  TOURNIQUET:  * No tourniquets in log *  DICTATION: .Dragon Dictation After informed consent was obtained the patient was brought to the operating room and placed in the supine position on the operating room table. After adequate induction of general anesthesia the patient's abdomen was prepped with ChloraPrep, wound dry, and draped in usual sterile manner. A site was chosen in the left upper quadrant for placement of a 5 mm port. This area was infiltrated with quarter percent Marcaine. A small stab incision was made with a 15 blade knife. A 5 mm Optiview port was used to bluntly dissect through the layers of the abdominal wall under direct vision until access was gained to the abdominal cavity. The ends and insufflated with carbon dioxide without difficulty. The laparoscope was placed through the 5 mm port and the abdomen was inspected. No obvious abnormalities were noted. 2 other 5 mm ports were placed under direct vision on the left side of the abdomen. Using a Glassman grasper and harmonic scalpel the right colon was identified. The right colon was mobilized by incising its retroperitoneal attachment on the white line of Toldt with the harmonic scalpel. The transverse colon was also mobilized off of the omentum and away from the liver and duodenum. Care was taken to  stay away from the duodenum. Once this was accomplished the right colon appeared to be very mobile. At this point we made a small supraumbilical midline incision with a 10 blade knife. This incision was carried through the skin and subcutaneous tissue sharply with electrocautery until the linea alba was identified. The linea alba also incised with the electrocautery. The preperitoneal space was probed with the hemostat until the peritoneum was opened next this was into the abdominal cavity. The rest of the incision was opened under direct vision. A wound protector was applied. The right colon was grasped with a Babcock and able to be brought up into the wound. A site was chosen on the transverse colon for division of the colon. The mesentery at this point was opened sharply with the electrocautery. A GI 75 stapler was placed across the colon at this point clamped and fired thereby dividing the colon between staple lines. A site was also chosen on the terminal ileum to divide the small bowel. The mesentery this point was opened sharply with electrocautery. A GIA 75 stapler was placed across the small bowel this point clamped and fired dividing the small bowel between staple lines. The mesentery to the right colon was taken down via combination of sharp dissection with the harmonic scalpel. The major vessels and the right colon mesentery were then clamped with Kelly clamps, divided, and ligated with 2-0 silk suture ligatures. Once this was accomplished the terminal ileum and right colon was removed. The polyp in question was easily palpated in the  cecum. The specimen was sent to pathology for further evaluation. The distal small bowel and proximal colon easily approximated each other. A small opening was made on the antimesenteric border of each limb of the small bowel and colon. Each limb of the GIA 75 stapler was then placed down the appropriate limb of small bowel or colon, clamped, and fired thereby creating a  nice widely patent enteroenterostomy. The common opening was closed with a TA 60 stapler. A 2-0 silk crotch stitch was also placed. The staple line was reinforced with 2-0 silk Lembert stitches. Once was accomplished and the anastomosis looked very good and healthy and was widely open. The mesenteric defect was closed with interrupted 2-0 silk stitches. The anastomosis was then placed back in the abdomen without difficulty. The abdomen was irrigated with copious amounts of saline. The fascia of the anterior abdominal wall was then closed with 2 running #1 double-stranded looped PDS sutures. The subcutaneous tissue was irrigated with copious amounts of saline. The skin incisions were closed with staples and sterile dressings were applied. The patient tolerated the procedure well. At the end of the case all needle sponge and instrument counts were correct. The patient was then awakened and taken to recovery in stable condition.  PLAN OF CARE: Admit to inpatient   PATIENT DISPOSITION:  PACU - hemodynamically stable.   Delay start of Pharmacological VTE agent (>24hrs) due to surgical blood loss or risk of bleeding: no

## 2013-01-31 NOTE — Transfer of Care (Signed)
Immediate Anesthesia Transfer of Care Note  Patient: Ebony James  Procedure(s) Performed: Procedure(s): LAPAROSCOPIC ASSISTED RIGHT COLECTOMY (Right)  Patient Location: PACU  Anesthesia Type:General  Level of Consciousness: awake and alert   Airway & Oxygen Therapy: Patient Spontanous Breathing and Patient connected to face mask oxygen  Post-op Assessment: Report given to PACU RN and Post -op Vital signs reviewed and stable  Post vital signs: Reviewed and stable  Complications: No apparent anesthesia complications

## 2013-01-31 NOTE — Anesthesia Procedure Notes (Signed)
Procedure Name: Intubation Date/Time: 01/31/2013 7:40 AM Performed by: Caren Macadam Pre-anesthesia Checklist: Patient identified, Emergency Drugs available, Suction available and Patient being monitored Patient Re-evaluated:Patient Re-evaluated prior to inductionOxygen Delivery Method: Circle System Utilized Preoxygenation: Pre-oxygenation with 100% oxygen Intubation Type: IV induction Ventilation: Mask ventilation without difficulty Laryngoscope Size: Miller, 2, Mac and 3 Grade View: Grade II Tube type: Oral Number of attempts: 1 Airway Equipment and Method: stylet and oral airway (Glidescope) Placement Confirmation: ETT inserted through vocal cords under direct vision,  positive ETCO2 and breath sounds checked- equal and bilateral Secured at: 21 cm Tube secured with: Tape Dental Injury: Injury to lip  Difficulty Due To: Difficulty was unanticipated and Difficult Airway- due to anterior larynx Future Recommendations: Recommend- induction with short-acting agent, and alternative techniques readily available Comments: Pt induced, Gladys Damme, CRNA DL X 2 with Hyacinth Meeker 2, good view of arytenoids, unable to view cords.  Sats 96-100%. KO DL X 1 with mac 3, ett into esop. recognized immediately. Sats 95-100%. Used glidescope to get view of cords, ett placed thru cords, +etco2, =bbs. Small lac to left lip noted.

## 2013-01-31 NOTE — H&P (Signed)
Ebony James  12/16/2012 1:50 PM   Office Visit  MRN:  [id number]   Description: 52 year old female  Provider: Robyne Askew, MD  Department: [facility]        Diagnoses    Tubular adenoma of colon    -  Primary    211.3      Reason for Visit    New Evaluation    eval cecal mass        Current Vitals - Last Recorded    BP Pulse Temp(Src) Ht Wt BMI    112/66 95 97.3 F (36.3 C) (Temporal) 5\' 3"  (1.6 m) 211 lb 3.2 oz (95.8 kg) 37.42 kg/m2       SpO2              98%                 Progress Notes    Robyne Askew, MD at 12/16/2012  2:16 PM    Status: Signed                   Patient ID: Ebony James, female   DOB: 1960/05/17, 52 y.o.   MRN: [id number]    Chief Complaint   Patient presents with   .  New Evaluation       eval cecal mass        Ebony James is a 52 y.o. female.  We are asked to see the patient in consultation by Dr. Madilyn Fireman to evaluate her for a right colon mass. The patient is a 52 year old black female who recently went for her first routine screening colonoscopy. At that time she was found to have a 4 cm mass in the right colon. This was biopsied and came back as a tubular adenoma. No dysplasia or malignancy was identified. She denied any abdominal pain. She denied any recent weight gain or weight loss. She denied any blood in her stool.  Ebony    Past Medical History   Diagnosis  Date   .  Menorrhagia     .  Fibroids, submucosal     .  Anemia     .  Vitamin D deficiency         H/O         Past Surgical History   Procedure  Laterality  Date   .  Cesarean section    1987         Family History   Problem  Relation  Age of Onset   .  Cancer  Mother         CERVICAL   .  Cancer  Sister         BREAST        Social History History   Substance Use Topics   .  Smoking status:  Never Smoker    .  Smokeless tobacco:  Never Used   .  Alcohol Use:  Yes         Comment: occassional        No Known  Allergies    Current Outpatient Prescriptions   Medication  Sig  Dispense  Refill   .  Iron-Vit C-Vit B12-Folic Acid (IRON 100 PLUS PO)  Take by mouth.         .  tranexamic acid (LYSTEDA) 650 MG TABS  Take 2 tablets (1,300 mg total) by mouth 3 (three) times daily.   30  tablet   11   .  Vitamin D, Ergocalciferol, (DRISDOL) 50000 UNITS CAPS  TAKE ONE CAPSULE 1 TIME A WEEK FOR 12 WEEKS   20 capsule   0       No current facility-administered medications for this visit.        Review of Systems Review of Systems  Constitutional: Negative.   HENT: Negative.   Eyes: Negative.   Respiratory: Negative.   Cardiovascular: Negative.   Gastrointestinal: Negative.   Endocrine: Negative.   Genitourinary: Negative.   Musculoskeletal: Negative.   Skin: Negative.   Allergic/Immunologic: Negative.   Neurological: Negative.   Hematological: Negative.   Psychiatric/Behavioral: Negative.       Blood pressure 112/66, pulse 95, temperature 97.3 F (36.3 C), temperature source Temporal, height 5\' 3"  (1.6 m), weight 211 lb 3.2 oz (95.8 kg), SpO2 98.00%.   Physical Exam Physical Exam  Constitutional: She is oriented to person, place, and time. She appears well-developed and well-nourished.  HENT:   Head: Normocephalic and atraumatic.  Eyes: Conjunctivae and EOM are normal. Pupils are equal, round, and reactive to light.  Neck: Normal range of motion. Neck supple.  Cardiovascular: Normal rate, regular rhythm and normal heart sounds.   Pulmonary/Chest: Effort normal and breath sounds normal.  Abdominal: Soft. Bowel sounds are normal. She exhibits no mass. There is no tenderness.  Musculoskeletal: Normal range of motion.  Neurological: She is alert and oriented to person, place, and time.  Skin: Skin is warm and dry.  Psychiatric: She has a normal mood and affect. Her behavior is normal.      Data Reviewed As above   Assessment    The patient appears to have a large tubular adenoma  in the cecum that was not able to be completely removed during colonoscopy. Unfortunately if this is left alone there is some risk that he could turn into a colon cancer. Because of this the recommendation would be to have this segment of her colon removed. I think she would be a good candidate for a laparoscopic-assisted type of case. I have discussed with her in detail the risks and benefits of the operation to remove this segment of colon as well as some of the technical aspects including the risk of leak and she understands and wishes to proceed. She does seem a little bit surprised  About the need for surgery and would like to talk to her family before scheduling.      Plan    Plan for laparoscopic-assisted right colectomy. I will be happy to talk to her and her family again if they have any other questions.

## 2013-01-31 NOTE — Progress Notes (Signed)
Report given to Kareema Keitt rn as caregiver 

## 2013-01-31 NOTE — Interval H&P Note (Signed)
History and Physical Interval Note:  01/31/2013 7:11 AM  Ebony James  has presented today for surgery, with the diagnosis of right colon polyp  The various methods of treatment have been discussed with the patient and family. After consideration of risks, benefits and other options for treatment, the patient has consented to  Procedure(s): LAPAROSCOPIC ASSISTED RIGHT COLECTOMY (Right) as a surgical intervention .  The patient's history has been reviewed, patient examined, no change in status, stable for surgery.  I have reviewed the patient's chart and labs.  Questions were answered to the patient's satisfaction.     TOTH III,Khristen Cheyney S

## 2013-01-31 NOTE — Anesthesia Postprocedure Evaluation (Signed)
Anesthesia Post Note  Patient: Ebony James  Procedure(s) Performed: Procedure(s) (LRB): LAPAROSCOPIC ASSISTED RIGHT COLECTOMY (Right)  Anesthesia type: general  Patient location: PACU  Post pain: Pain level controlled  Post assessment: Patient's Cardiovascular Status Stable  Last Vitals:  Filed Vitals:   01/31/13 1115  BP: 136/71  Pulse: 94  Temp: 36.3 C  Resp: 16    Post vital signs: Reviewed and stable  Level of consciousness: sedated  Complications: No apparent anesthesia complications

## 2013-01-31 NOTE — Anesthesia Preprocedure Evaluation (Addendum)
Anesthesia Evaluation  Patient identified by MRN, date of birth, ID band Patient awake    Reviewed: Allergy & Precautions, H&P , NPO status , Patient's Chart, lab work & pertinent test results  Airway Mallampati: I TM Distance: <3 FB Neck ROM: full    Dental  (+) Teeth Intact   Pulmonary          Cardiovascular Rhythm:regular Rate:Normal     Neuro/Psych    GI/Hepatic   Endo/Other    Renal/GU      Musculoskeletal   Abdominal   Peds  Hematology  (+) anemia ,   Anesthesia Other Findings   Reproductive/Obstetrics                           Anesthesia Physical Anesthesia Plan  ASA: II  Anesthesia Plan: General ETT   Post-op Pain Management:    Induction: Intravenous  Airway Management Planned: Oral ETT  Additional Equipment:   Intra-op Plan:   Post-operative Plan: Extubation in OR  Informed Consent: I have reviewed the patients History and Physical, chart, labs and discussed the procedure including the risks, benefits and alternatives for the proposed anesthesia with the patient or authorized representative who has indicated his/her understanding and acceptance.   Dental Advisory Given  Plan Discussed with: CRNA and Surgeon  Anesthesia Plan Comments:        Anesthesia Quick Evaluation

## 2013-02-01 LAB — BASIC METABOLIC PANEL
CO2: 23 mEq/L (ref 19–32)
Creatinine, Ser: 0.54 mg/dL (ref 0.50–1.10)
Glucose, Bld: 117 mg/dL — ABNORMAL HIGH (ref 70–99)
Potassium: 3.5 mEq/L (ref 3.5–5.1)
Sodium: 139 mEq/L (ref 135–145)

## 2013-02-01 LAB — CBC
HCT: 27.8 % — ABNORMAL LOW (ref 36.0–46.0)
Hemoglobin: 8.1 g/dL — ABNORMAL LOW (ref 12.0–15.0)
MCH: 22.4 pg — ABNORMAL LOW (ref 26.0–34.0)
MCHC: 29.1 g/dL — ABNORMAL LOW (ref 30.0–36.0)
MCV: 76.8 fL — ABNORMAL LOW (ref 78.0–100.0)
RBC: 3.62 MIL/uL — ABNORMAL LOW (ref 3.87–5.11)
RDW: 19.1 % — ABNORMAL HIGH (ref 11.5–15.5)

## 2013-02-01 NOTE — Progress Notes (Signed)
General Surgery Note  LOS: 1 day  POD -  1 Day Post-Op  Assessment/Plan: 1.  LAPAROSCOPIC ASSISTED RIGHT COLECTOMY  Looks good early post op   2.  DVT prophylaxis - SQ Heparin   Subjective:  Sore.  But doing okay.  Has walked in hall. Objective:   Filed Vitals:   02/01/13 1023  BP: 132/76  Pulse: 98  Temp: 98.6 F (37 C)  Resp: 18     Intake/Output from previous day:  10/24 0701 - 10/25 0700 In: 4597.7 [I.V.:4597.7] Out: 2850 [Urine:2850]  Intake/Output this shift:      Physical Exam:   General: Obese AA F who is alert and oriented.    HEENT: Normal. Pupils equal. .   Lungs: Clear.  Modest inspiratory effort.   Abdomen: Quiet.   Wound: Okay.   Lab Results:    Recent Labs  02/01/13 0445  WBC 12.3*  HGB 8.1*  HCT 27.8*  PLT 279    BMET   Recent Labs  02/01/13 0445  NA 139  K 3.5  CL 105  CO2 23  GLUCOSE 117*  BUN 5*  CREATININE 0.54  CALCIUM 8.3*    PT/INR  No results found for this basename: LABPROT, INR,  in the last 72 hours  ABG  No results found for this basename: PHART, PCO2, PO2, HCO3,  in the last 72 hours   Studies/Results:  No results found.   Anti-infectives:   Anti-infectives   Start     Dose/Rate Route Frequency Ordered Stop   01/31/13 0600  cefOXitin (MEFOXIN) 2 g in dextrose 5 % 50 mL IVPB     2 g 100 mL/hr over 30 Minutes Intravenous On call to O.R. 01/30/13 1410 01/31/13 0741      Ovidio Kin, MD, FACS Pager: [phone] [facility] Office: [phone] 02/01/2013

## 2013-02-02 NOTE — Progress Notes (Signed)
2 Days Post-Op  Subjective: Feeling better. Still sore but improving. No nausea  Objective: Vital signs in last 24 hours: Temp:  [98.4 F (36.9 C)-98.9 F (37.2 C)] 98.9 F (37.2 C) (10/26 0551) Pulse Rate:  [94-103] 103 (10/26 0551) Resp:  [16-18] 18 (10/26 0551) BP: (125-147)/(62-98) 147/98 mmHg (10/26 0551) SpO2:  [98 %-100 %] 100 % (10/26 0551) Last BM Date: 01/30/13  Intake/Output from previous day: 10/25 0701 - 10/26 0700 In: 2410.7 [I.V.:2410.7] Out: 350 [Urine:350] Intake/Output this shift:    Resp: clear to auscultation bilaterally Cardio: regular rate and rhythm GI: soft, mild tenderness. few bs. incision looks good  Lab Results:   Recent Labs  02/01/13 0445  WBC 12.3*  HGB 8.1*  HCT 27.8*  PLT 279   BMET  Recent Labs  02/01/13 0445  NA 139  K 3.5  CL 105  CO2 23  GLUCOSE 117*  BUN 5*  CREATININE 0.54  CALCIUM 8.3*   PT/INR No results found for this basename: LABPROT, INR,  in the last 72 hours ABG No results found for this basename: PHART, PCO2, PO2, HCO3,  in the last 72 hours  Studies/Results: No results found.  Anti-infectives: Anti-infectives   Start     Dose/Rate Route Frequency Ordered Stop   01/31/13 0600  cefOXitin (MEFOXIN) 2 g in dextrose 5 % 50 mL IVPB     2 g 100 mL/hr over 30 Minutes Intravenous On call to O.R. 01/30/13 1410 01/31/13 0741      Assessment/Plan: s/p Procedure(s): LAPAROSCOPIC ASSISTED RIGHT COLECTOMY (Right) Advance diet. Start clears today entereg ambulate  LOS: 2 days    TOTH III,PAUL S 02/02/2013

## 2013-02-03 ENCOUNTER — Encounter (HOSPITAL_COMMUNITY): Payer: Self-pay | Admitting: General Surgery

## 2013-02-03 MED ORDER — PANTOPRAZOLE SODIUM 40 MG PO TBEC
40.0000 mg | DELAYED_RELEASE_TABLET | Freq: Every day | ORAL | Status: DC
  Administered 2013-02-04: 40 mg via ORAL
  Filled 2013-02-03: qty 1

## 2013-02-03 MED ORDER — HYDROCODONE-ACETAMINOPHEN 5-325 MG PO TABS
1.0000 | ORAL_TABLET | ORAL | Status: DC | PRN
  Administered 2013-02-03 – 2013-02-04 (×5): 2 via ORAL
  Filled 2013-02-03 (×5): qty 2

## 2013-02-03 NOTE — Progress Notes (Signed)
3 Days Post-Op  Subjective: She is doing well. She is up walking in the halls this am  Objective: Vital signs in last 24 hours: Temp:  [98.3 F (36.8 C)-98.8 F (37.1 C)] 98.4 F (36.9 C) (10/27 0517) Pulse Rate:  [80-97] 97 (10/27 0517) Resp:  [15-18] 18 (10/27 0517) BP: (137-149)/(76-84) 142/81 mmHg (10/27 0517) SpO2:  [100 %] 100 % (10/27 0517) Last BM Date: 01/30/13  Intake/Output from previous day:   Intake/Output this shift:    Resp: clear to auscultation bilaterally Cardio: regular rate and rhythm GI: soft, nontender  Lab Results:   Recent Labs  02/01/13 0445  WBC 12.3*  HGB 8.1*  HCT 27.8*  PLT 279   BMET  Recent Labs  02/01/13 0445  NA 139  K 3.5  CL 105  CO2 23  GLUCOSE 117*  BUN 5*  CREATININE 0.54  CALCIUM 8.3*   PT/INR No results found for this basename: LABPROT, INR,  in the last 72 hours ABG No results found for this basename: PHART, PCO2, PO2, HCO3,  in the last 72 hours  Studies/Results: No results found.  Anti-infectives: Anti-infectives   Start     Dose/Rate Route Frequency Ordered Stop   01/31/13 0600  cefOXitin (MEFOXIN) 2 g in dextrose 5 % 50 mL IVPB     2 g 100 mL/hr over 30 Minutes Intravenous On call to O.R. 01/30/13 1410 01/31/13 0741      Assessment/Plan: s/p Procedure(s): LAPAROSCOPIC ASSISTED RIGHT COLECTOMY (Right) Advance diet. Start fulls today ambulate  LOS: 3 days    TOTH III,Kilo Eshelman S 02/03/2013

## 2013-02-04 ENCOUNTER — Other Ambulatory Visit (INDEPENDENT_AMBULATORY_CARE_PROVIDER_SITE_OTHER): Payer: Self-pay

## 2013-02-04 DIAGNOSIS — C801 Malignant (primary) neoplasm, unspecified: Secondary | ICD-10-CM

## 2013-02-04 NOTE — Discharge Summary (Signed)
Physician Discharge Summary  Patient ID: Ebony James MRN: [id number] DOB/AGE: 01-04-1961 52 y.o.  Admit date: 01/31/2013 Discharge date: 02/04/2013  Admission Diagnoses:  Discharge Diagnoses:  Active Problems:   * No active hospital problems. *   Discharged Condition: good  Hospital Course: The pt underwent lap assisted right colectomy. She tolerated the operation well. She was given entereg. Postoperatively she had no problems and gradually improved. She is pod 4 and ready for discharge home  Consults: None  Significant Diagnostic Studies: none  Treatments: surgery: as above  Discharge Exam: Blood pressure 138/83, pulse 90, temperature 98.1 F (36.7 C), temperature source Oral, resp. rate 16, height 5\' 3"  (1.6 m), weight 211 lb (95.709 kg), SpO2 100.00%. Resp: clear to auscultation bilaterally Cardio: regular rate and rhythm GI: soft, nontender  Disposition: Final discharge disposition not confirmed  Discharge Orders   Future Orders Complete By Expires   Call MD for:  difficulty breathing, headache or visual disturbances  As directed    Call MD for:  extreme fatigue  As directed    Call MD for:  hives  As directed    Call MD for:  persistant dizziness or light-headedness  As directed    Call MD for:  persistant nausea and vomiting  As directed    Call MD for:  redness, tenderness, or signs of infection (pain, swelling, redness, odor or green/yellow discharge around incision site)  As directed    Call MD for:  severe uncontrolled pain  As directed    Call MD for:  temperature >100.4  As directed    Diet - low sodium heart healthy  As directed    Discharge instructions  As directed    Comments:     May shower. Diet as tolerated. No heavy lifting   Increase activity slowly  As directed    No wound care  As directed        Medication List         ferrous sulfate 325 (65 FE) MG tablet  Take 325 mg by mouth daily with breakfast.     Melatonin 5 MG Tabs  Take  by mouth at bedtime.     naproxen sodium 220 MG tablet  Commonly known as:  ANAPROX  Take 440 mg by mouth daily as needed (for pain).           Follow-up Information   Follow up with Robyne Askew, MD In 1 week.   Specialty:  General Surgery   Contact information:   [address] [place] [address] [phone]       Signed: Robyne Askew 02/04/2013, 8:47 AM

## 2013-02-04 NOTE — Progress Notes (Signed)
4 Days Post-Op  Subjective: No complaints. Having bm's now  Objective: Vital signs in last 24 hours: Temp:  [98.1 F (36.7 C)-98.4 F (36.9 C)] 98.1 F (36.7 C) (10/28 0556) Pulse Rate:  [81-90] 90 (10/28 0556) Resp:  [16-18] 16 (10/28 0556) BP: (138-140)/(67-83) 138/83 mmHg (10/28 0556) SpO2:  [100 %] 100 % (10/28 0556) Last BM Date: 02/03/13  Intake/Output from previous day: 10/27 0701 - 10/28 0700 In: 520 [P.O.:360; I.V.:160] Out: -  Intake/Output this shift:    Resp: clear to auscultation bilaterally Cardio: regular rate and rhythm GI: soft, nontender. incision looks good  Lab Results:  No results found for this basename: WBC, HGB, HCT, PLT,  in the last 72 hours BMET No results found for this basename: NA, K, CL, CO2, GLUCOSE, BUN, CREATININE, CALCIUM,  in the last 72 hours PT/INR No results found for this basename: LABPROT, INR,  in the last 72 hours ABG No results found for this basename: PHART, PCO2, PO2, HCO3,  in the last 72 hours  Studies/Results: No results found.  Anti-infectives: Anti-infectives   Start     Dose/Rate Route Frequency Ordered Stop   01/31/13 0600  cefOXitin (MEFOXIN) 2 g in dextrose 5 % 50 mL IVPB     2 g 100 mL/hr over 30 Minutes Intravenous On call to O.R. 01/30/13 1410 01/31/13 0741      Assessment/Plan: s/p Procedure(s): LAPAROSCOPIC ASSISTED RIGHT COLECTOMY (Right) Advance diet Discharge  LOS: 4 days    TOTH III,PAUL S 02/04/2013

## 2013-02-04 NOTE — Discharge Planning (Signed)
Pt d/c in stable condition with husband,discharge instructions given & pt demonstrated good understanding.

## 2013-02-05 LAB — TYPE AND SCREEN: ABO/RH(D): B POS

## 2013-02-06 ENCOUNTER — Encounter (INDEPENDENT_AMBULATORY_CARE_PROVIDER_SITE_OTHER): Payer: Managed Care, Other (non HMO)

## 2013-02-06 ENCOUNTER — Telehealth: Payer: Self-pay | Admitting: *Deleted

## 2013-02-06 NOTE — Telephone Encounter (Signed)
Spoke with patient by phone and confirmed appointment with Dr. Truett Perna for 02/18/13.  Contact names and numbers were provided.  Patient was without questions.

## 2013-02-07 ENCOUNTER — Encounter (INDEPENDENT_AMBULATORY_CARE_PROVIDER_SITE_OTHER): Payer: Self-pay

## 2013-02-07 ENCOUNTER — Ambulatory Visit (INDEPENDENT_AMBULATORY_CARE_PROVIDER_SITE_OTHER): Payer: Managed Care, Other (non HMO)

## 2013-02-07 VITALS — BP 148/86 | HR 88 | Temp 97.9°F | Resp 16 | Ht 63.0 in | Wt 210.4 lb

## 2013-02-07 DIAGNOSIS — Z4802 Encounter for removal of sutures: Secondary | ICD-10-CM

## 2013-02-07 NOTE — Progress Notes (Signed)
Patient comes into office today for staple removal.  Patient s/p Laparoscopic Right Colectomy on 01/31/13.  Abdominal incision appears to be healing well.  Proceeded with staple removal, incision intact.  Placed steri-strips over incision.  Patient tolerated well.  Patient reports voiding and bowel movements without difficulty.  Patient denies having any fever, nausea/vomting or abdominal pain.  Patient given post op appointment for 02/21/13 @2 :40pm w/Dr. Carolynne Edouard. Patient advised to contact our office if she has any questions or concerns.

## 2013-02-10 ENCOUNTER — Telehealth: Payer: Self-pay | Admitting: *Deleted

## 2013-02-10 NOTE — Telephone Encounter (Signed)
Received microsatellite instability results from [employer] on [id number]- [id number]- [id number]. Results to Dr. Kalman Drape desk for review.

## 2013-02-18 ENCOUNTER — Other Ambulatory Visit: Payer: Self-pay | Admitting: *Deleted

## 2013-02-18 ENCOUNTER — Telehealth: Payer: Self-pay | Admitting: Oncology

## 2013-02-18 ENCOUNTER — Ambulatory Visit (HOSPITAL_BASED_OUTPATIENT_CLINIC_OR_DEPARTMENT_OTHER): Payer: Managed Care, Other (non HMO) | Admitting: Oncology

## 2013-02-18 ENCOUNTER — Ambulatory Visit: Payer: Managed Care, Other (non HMO)

## 2013-02-18 ENCOUNTER — Encounter: Payer: Self-pay | Admitting: Oncology

## 2013-02-18 VITALS — BP 148/82 | HR 85 | Temp 98.5°F | Resp 20 | Ht 63.0 in | Wt 213.6 lb

## 2013-02-18 DIAGNOSIS — D509 Iron deficiency anemia, unspecified: Secondary | ICD-10-CM

## 2013-02-18 DIAGNOSIS — C189 Malignant neoplasm of colon, unspecified: Secondary | ICD-10-CM

## 2013-02-18 DIAGNOSIS — C180 Malignant neoplasm of cecum: Secondary | ICD-10-CM

## 2013-02-18 NOTE — Progress Notes (Signed)
Met with Lucien Mons and family. Explained role of nurse navigator. Educational information provided on colorectal cancer  Per MD, patient will not need treatment.  She was educated that she will need a follow-up colonoscopy 1 year from her last one.  She was referred to genetics counselor.  She declined having bloodwork today and requested to have it done over next few weeks when she returns for visit.  She was educated to start a regular exercise routine and eat a healthy, low-fat diet.  Referral made to dietician for diet education. [facility] resources provided to patient, including SW service information and other programs at [facility].  Contact names and phone numbers were provided for entire [facility] team.  Teach back method was used.  No barriers to care identified.  Patient verbalized understanding and was without questions.

## 2013-02-18 NOTE — Progress Notes (Signed)
Patient reports she still has regular menstrual periods, but are longer with heavy flow due to uterine fibroids. Does not practice any birth control (due to her age per patient).

## 2013-02-18 NOTE — Progress Notes (Signed)
Checked in new pt with no financial concerns. °

## 2013-02-18 NOTE — Progress Notes (Signed)
[facility] New Patient Consult   Referring MD: Rozanne Heumann 52 y.o.  12-Nov-1960    Reason for Referral: Colon cancer     HPI: She reports an isolated episode of right abdominal pain approximately 4 years ago. She felt well when she was referred to Dr. Madilyn Fireman for a screening colonoscopy on 11/28/2012. A polypoid nonobstructing mass was found in the cecum. The mass measured 4 cm. The mass was biopsied. No other abnormality. The pathology confirmed fragments of a tubular adenoma.  She was referred to Dr. Carolynne Edouard for removal of the mass and was taken to the operating room on 01/31/2013 for a laparoscopic assisted right colectomy. The polyp was palpated in the cecum. A right colectomy was performed. 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 Tumor invaded into the submucosa. No lymphovascular invasion wasn't and 5. The surgical margins were negative. 19 lymph nodes were negative for tumor. The appendix was negative for atypia or malignancy. The tumor measured 1.1 cm. The tumor was well-differentiated. The tumor returned microsatellite stable with no loss of mismatch repair protein expression.    Past Medical History  Diagnosis Date  . Menorrhagia   . Fibroids, submucosal   . Anemia-microcytic    . Vitamin D deficiency     H/O   .    G2 P2  Past Surgical History  Procedure Laterality Date  . Cesarean section  1987  . Laparoscopic right colectomy Right 01/31/2013    Procedure: LAPAROSCOPIC ASSISTED RIGHT COLECTOMY;  Surgeon: Robyne Askew, MD;  Location: [facility] OR;  Service: General;  Laterality: Right;         Family History  Problem Relation Age of Onset  . Cancer Mother     CERVICAL  . Cancer Sister     BREAST    Current outpatient prescriptions:ferrous sulfate 325 (65 FE) MG tablet, Take 325 mg by mouth daily with breakfast., Disp: , Rfl:   Allergies: No Known  Allergies  Social History: She works in Clinical biochemist. She does not use tobacco. She drinks wine occasionally. No transfusion history. No risk factor for HIV or hepatitis.   ROS:   Positives include: Constipation, "hemorrhoids "  A complete ROS was otherwise negative.  Physical Exam:  Blood pressure 148/82, pulse 85, temperature 98.5 F (36.9 C), temperature source Oral, resp. rate 20, height 5\' 3"  (1.6 m), weight 213 lb 9.6 oz (96.888 kg), last menstrual period 01/14/2013, SpO2 100.00%.  HEENT: Oral cavity without visible mass, I could not visualize the pharynx, neck without mass Lungs: Clear bilaterally Cardiac: Regular rate and rhythm Abdomen: No hepatosplenomegaly, no mass, healed surgical incisions  Vascular: No leg edema Lymph nodes: No cervical, supraclavicular, axillary, or inguinal nodes Neurologic: Alert and oriented, the motor exam appears intact in the upper and lower extremities Skin: No rash Musculoskeletal: No spine tenderness   LAB:  CBC  Lab Results  Component Value Date   WBC 12.3* 02/01/2013   HGB 8.1* 02/01/2013   HCT 27.8* 02/01/2013   MCV 76.8* 02/01/2013   PLT 279 02/01/2013    01/23/2013-hemolytic 8.6, MCV 75.6 CMP      Component Value Date/Time   NA 139 02/01/2013 0445   K 3.5 02/01/2013 0445   CL 105 02/01/2013 0445   CO2 23 02/01/2013 0445   GLUCOSE 117* 02/01/2013 0445   BUN 5* 02/01/2013 0445   CREATININE 0.54 02/01/2013 0445   CALCIUM 8.3* 02/01/2013  0445   GFRNONAA >90 02/01/2013 0445   GFRAA >90 02/01/2013 0445     Radiology: None    Assessment/Plan:   1. 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 The tumor returned microsatellite stable with no loss of mismatch repair protein expression.  2. Microcytic anemia-likely iron deficiency,? Secondary to menorrhagia versus bleeding from the cecum mass  3. Family history of breast and ovarian  cancer   Disposition:   Ms. Fuerte has been diagnosed with stage I colon cancer. I discussed the diagnosis, prognosis, and adjuvant treatment options with Ms. Denise and her family. We reviewed the details of the surgical pathology report. She has a good prognosis for a long-term disease-free survival. There is no indication for adjuvant therapy.  We discussed exercise and diet maneuvers that may decrease the risk of colon cancer. She should continue surveillance colonoscopies with Dr. Madilyn Fireman.  Her mother has a history of ovarian cancer and her sister had breast cancer. We will make a referral to the genetics screening clinic to consider the indication for genetic testing.  I recommended she resume ferrous sulfate for treatment of the iron deficiency anemia. She declined a laboratory evaluation today. We will check a CBC and CEA when she returns the genetics appointment.  Ms. Nobrega is not scheduled for a followup appointment at the [facility]. She plans to continue clinical followup with Dr. Ihor Dow and Dr. Madilyn Fireman. We will be glad to see her in the future as needed.  Persais Ethridge 02/18/2013, 5:13 PM

## 2013-02-18 NOTE — Telephone Encounter (Signed)
per 02/18/13 POF scheduled appt w dietician and genetics and labs AVS and cal given to pt shh

## 2013-02-21 ENCOUNTER — Encounter (INDEPENDENT_AMBULATORY_CARE_PROVIDER_SITE_OTHER): Payer: Self-pay | Admitting: General Surgery

## 2013-02-21 ENCOUNTER — Ambulatory Visit (INDEPENDENT_AMBULATORY_CARE_PROVIDER_SITE_OTHER): Payer: Managed Care, Other (non HMO) | Admitting: General Surgery

## 2013-02-21 ENCOUNTER — Ambulatory Visit: Payer: Managed Care, Other (non HMO) | Admitting: Nutrition

## 2013-02-21 VITALS — BP 138/80 | HR 76 | Temp 97.8°F | Resp 14 | Ht 63.0 in | Wt 215.4 lb

## 2013-02-21 DIAGNOSIS — C189 Malignant neoplasm of colon, unspecified: Secondary | ICD-10-CM

## 2013-02-21 NOTE — Progress Notes (Signed)
Patient is a 52 year old female diagnosed with stage I colon cancer.  Past medical history includes anemia and vitamin D deficiency.  Medications include ferrous sulfate.  Labs include glucose of 117.  Height: 63 inches. Weight: 213.6 pounds. BMI: 37.85.  Patient presents for nutrition counseling on diet for prevention of colon cancer recurrence.  Nutrition diagnosis: Food and nutrition related knowledge deficit related to new diagnosis of stage I colon cancer as evidenced by no prior need for nutrition related education.  Intervention: Patient was educated on the importance of increasing plant-based foods, and reducing processed foods in her diet.  She was encouraged to increase vegetables and fruit consumption to a minimum of 2 and half cups daily.  She was educated on exercise recommendations.  Fact sheets were provided.  Questions were answered.  Teach back method was used.  Monitoring, evaluation, goals: Patient will tolerate a healthy, plant-based diet to reduce chance of colon cancer recurrence.  Next visit: No followup scheduled.  Patient has my contact information for questions or concerns.

## 2013-02-21 NOTE — Patient Instructions (Signed)
No heavy lifting 

## 2013-02-24 NOTE — Progress Notes (Signed)
Subjective:     Patient ID: Ebony James, female   DOB: 1960-07-24, 52 y.o.   MRN: [id number]  HPI The patient is a 52 year old black female who is 3 weeks status post laparoscopic-assisted right colectomy. 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 She has done very well since the surgery. She denies any abdominal pain. Her appetite is good and her bowels are working normally. She has met with oncology and there is no plan for chemotherapy  Review of Systems     Objective:   Physical Exam On exam her abdomen is soft and nontender. Her incision is healed nicely with no sign of infection. Her abdominal wall feels very solid with no palpable evidence of hernia.    Assessment:     The patient is 3 weeks status post right colectomy for colon cancer     Plan:     At this point I would like her to refrain from any heavy lifting. I will plan to see her back in one month to check her progress.

## 2013-02-26 ENCOUNTER — Other Ambulatory Visit (HOSPITAL_BASED_OUTPATIENT_CLINIC_OR_DEPARTMENT_OTHER): Payer: Managed Care, Other (non HMO) | Admitting: Lab

## 2013-02-26 ENCOUNTER — Other Ambulatory Visit: Payer: Self-pay | Admitting: Obstetrics and Gynecology

## 2013-02-26 DIAGNOSIS — C189 Malignant neoplasm of colon, unspecified: Secondary | ICD-10-CM

## 2013-02-26 DIAGNOSIS — C180 Malignant neoplasm of cecum: Secondary | ICD-10-CM

## 2013-02-26 DIAGNOSIS — Z1231 Encounter for screening mammogram for malignant neoplasm of breast: Secondary | ICD-10-CM

## 2013-02-26 LAB — CBC WITH DIFFERENTIAL/PLATELET
BASO%: 2.3 % — ABNORMAL HIGH (ref 0.0–2.0)
HCT: 31 % — ABNORMAL LOW (ref 34.8–46.6)
LYMPH%: 26.4 % (ref 14.0–49.7)
MCH: 21.7 pg — ABNORMAL LOW (ref 25.1–34.0)
MCHC: 29.5 g/dL — ABNORMAL LOW (ref 31.5–36.0)
MONO#: 0.7 10*3/uL (ref 0.1–0.9)
MONO%: 13.3 % (ref 0.0–14.0)
NEUT%: 50.8 % (ref 38.4–76.8)
Platelets: 373 10*3/uL (ref 145–400)
RBC: 4.2 10*6/uL (ref 3.70–5.45)

## 2013-02-28 ENCOUNTER — Telehealth: Payer: Self-pay | Admitting: Medical Oncology

## 2013-02-28 NOTE — Telephone Encounter (Signed)
Pt notified via V/M

## 2013-02-28 NOTE — Telephone Encounter (Signed)
Message copied by Charma Igo on Fri Feb 28, 2013  3:54 PM ------      Message from: Wandalee Ferdinand      Created: Fri Feb 28, 2013  2:42 PM                   ----- Message -----         From: Raphael Gibney, LPN         Sent: 02/27/2013  11:38 AM           To: Wandalee Ferdinand, RN                        ----- Message -----         From: Wandalee Ferdinand, RN         Sent: 02/27/2013   9:51 AM           To: Raphael Gibney, LPN                        ----- Message -----         From: Ladene Artist, MD         Sent: 02/26/2013   7:50 PM           To: Wandalee Ferdinand, RN, Glori Luis, RN, #            Please call patient, hb is still low, cont. Iron, copy to primary md, needs f/u with primary md to check hb next 2 months ------

## 2013-02-28 NOTE — Telephone Encounter (Signed)
I left a VM on pts phone  To continue iron and f/u with PCPand hgb in 2 months . I faxed a  copy of lab to Dr Ihor Dow. PCP.

## 2013-03-04 ENCOUNTER — Encounter (HOSPITAL_COMMUNITY): Payer: Self-pay

## 2013-03-19 ENCOUNTER — Ambulatory Visit (HOSPITAL_COMMUNITY)
Admission: RE | Admit: 2013-03-19 | Discharge: 2013-03-19 | Disposition: A | Discharge status: Home / Self Care | Payer: Managed Care, Other (non HMO) | Source: Ambulatory Visit | Attending: Obstetrics and Gynecology | Admitting: Obstetrics and Gynecology

## 2013-03-19 DIAGNOSIS — Z1231 Encounter for screening mammogram for malignant neoplasm of breast: Secondary | ICD-10-CM | POA: Insufficient documentation

## 2013-03-25 ENCOUNTER — Encounter (INDEPENDENT_AMBULATORY_CARE_PROVIDER_SITE_OTHER): Payer: Managed Care, Other (non HMO) | Admitting: General Surgery

## 2013-03-25 ENCOUNTER — Other Ambulatory Visit: Payer: Self-pay | Admitting: Obstetrics and Gynecology

## 2013-03-25 DIAGNOSIS — R928 Other abnormal and inconclusive findings on diagnostic imaging of breast: Secondary | ICD-10-CM

## 2013-03-27 ENCOUNTER — Ambulatory Visit
Admission: RE | Admit: 2013-03-27 | Discharge: 2013-03-27 | Disposition: A | Discharge status: Home / Self Care | Payer: Managed Care, Other (non HMO) | Source: Ambulatory Visit | Attending: Obstetrics and Gynecology | Admitting: Obstetrics and Gynecology

## 2013-03-27 DIAGNOSIS — R928 Other abnormal and inconclusive findings on diagnostic imaging of breast: Secondary | ICD-10-CM

## 2013-04-04 ENCOUNTER — Encounter (INDEPENDENT_AMBULATORY_CARE_PROVIDER_SITE_OTHER): Payer: Managed Care, Other (non HMO) | Admitting: General Surgery

## 2013-04-07 ENCOUNTER — Other Ambulatory Visit: Payer: Managed Care, Other (non HMO)

## 2013-04-30 ENCOUNTER — Encounter (INDEPENDENT_AMBULATORY_CARE_PROVIDER_SITE_OTHER): Payer: Managed Care, Other (non HMO) | Admitting: General Surgery

## 2013-05-13 ENCOUNTER — Encounter (INDEPENDENT_AMBULATORY_CARE_PROVIDER_SITE_OTHER): Payer: Self-pay | Admitting: General Surgery

## 2013-05-13 ENCOUNTER — Ambulatory Visit (INDEPENDENT_AMBULATORY_CARE_PROVIDER_SITE_OTHER): Payer: Managed Care, Other (non HMO) | Admitting: General Surgery

## 2013-05-13 VITALS — BP 128/72 | HR 85 | Temp 98.3°F | Resp 18 | Ht 62.0 in | Wt 218.0 lb

## 2013-05-13 DIAGNOSIS — C189 Malignant neoplasm of colon, unspecified: Secondary | ICD-10-CM

## 2013-05-13 NOTE — Progress Notes (Signed)
Subjective:     Patient ID: Ebony James, female   DOB: [birth year], 53 y.o.   MRN: [id number]  HPI The patient is a 53 year old black female who is 3 months status post laparoscopic assisted right colectomy for a small invasive cancer found within a polyp. She has done very well and feels great today. She has no complaints. Her appetite is good and her bowels are working normally.  Review of Systems  Constitutional: Negative.   HENT: Negative.   Eyes: Negative.   Respiratory: Negative.   Cardiovascular: Negative.   Gastrointestinal: Negative.   Endocrine: Negative.   Genitourinary: Negative.   Musculoskeletal: Negative.   Skin: Negative.   Allergic/Immunologic: Negative.   Neurological: Negative.   Hematological: Negative.   Psychiatric/Behavioral: Negative.        Objective:   Physical Exam  Constitutional: She is oriented to person, place, and time. She appears well-developed and well-nourished.  HENT:  Head: Normocephalic and atraumatic.  Eyes: Conjunctivae and EOM are normal. Pupils are equal, round, and reactive to light.  Neck: Normal range of motion. Neck supple.  Cardiovascular: Normal rate, regular rhythm and normal heart sounds.   Pulmonary/Chest: Effort normal and breath sounds normal.  Abdominal: Soft. Bowel sounds are normal.  There is no palpable groin or supraclavicular lymphadenopathy  Musculoskeletal: Normal range of motion.  Lymphadenopathy:    She has no cervical adenopathy.  Neurological: She is alert and oriented to person, place, and time.  Skin: Skin is warm and dry.  Psychiatric: She has a normal mood and affect. Her behavior is normal.       Assessment:     The patient is 3 months status post laparoscopic right colectomy for a small colon cancer     Plan:     At this point she will continue to followup with her medical oncologist. She will need a followup colonoscopy when she is one year out. I will plan to see her back in about 6 months.

## 2013-05-22 ENCOUNTER — Ambulatory Visit (HOSPITAL_BASED_OUTPATIENT_CLINIC_OR_DEPARTMENT_OTHER): Payer: Managed Care, Other (non HMO) | Admitting: Genetic Counselor

## 2013-05-22 ENCOUNTER — Encounter: Payer: Self-pay | Admitting: Genetic Counselor

## 2013-05-22 DIAGNOSIS — C189 Malignant neoplasm of colon, unspecified: Secondary | ICD-10-CM

## 2013-05-22 DIAGNOSIS — C180 Malignant neoplasm of cecum: Secondary | ICD-10-CM

## 2013-05-22 DIAGNOSIS — Z803 Family history of malignant neoplasm of breast: Secondary | ICD-10-CM

## 2013-05-22 DIAGNOSIS — IMO0002 Reserved for concepts with insufficient information to code with codable children: Secondary | ICD-10-CM

## 2013-05-22 NOTE — Progress Notes (Signed)
Dr.  Donneta Romberg requested a consultation for genetic counseling and risk assessment for Ebony James, a 53 y.o. female, for discussion of her personal history of colon cancer and family history of breast and either cervical or ovarian cancer.  She presents to clinic today to discuss the possibility of a genetic predisposition to cancer, and to further clarify her risks, as well as her family members' risks for cancer.   HISTORY OF PRESENT ILLNESS: In 2014, at the age of 31, Ebony James was diagnosed with colon cancer. This was treated with surgery.  Tumor testing showed MSI-stability and no loss of expression in IHC.   She had a mammogram in 2014 that indicated that she has breast density category C, and was found to have a cyst, most likely benign.  Her PCP wants her to follow up on it, but she is hesitant to do so.   Past Medical History  Diagnosis Date  . Menorrhagia   . Fibroids, submucosal   . Anemia   . Vitamin D deficiency     H/O  . Colon cancer     Past Surgical History  Procedure Laterality Date  . Cesarean section  1987  . Laparoscopic right colectomy Right 01/31/2013    Procedure: LAPAROSCOPIC ASSISTED RIGHT COLECTOMY;  Surgeon: Merrie Roof, MD;  Location: [facility];  Service: General;  Laterality: Right;  . Colon surgery      History   Social History  . Marital Status: Married    Spouse Name: Ebony James    Number of Children: 2  . Years of Education: N/A   Occupational History  . 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 Smoking status: Never Smoker   . Smokeless tobacco: Never Used  . Alcohol Use: Yes     Comment: occassional  . Drug Use: No  . Sexual Activity: Yes    Birth Control/ Protection: None   Other Topics Concern  . None   Social History Narrative   Married, husband Ebony James   Employed by [employer] [place]   Has #2 grown children and #1 infant grandchild    REPRODUCTIVE HISTORY AND PERSONAL RISK ASSESSMENT  FACTORS: Menarche was at age 76.   perimenopausal Uterus Intact: yes Ovaries Intact: yes G2P2A0, first live birth at age 60  She has not previously undergone treatment for infertility.   Oral Contraceptive use: 3 years   She has not used HRT in the past.    FAMILY HISTORY:  We obtained a detailed, 4-generation family history.  Significant diagnoses are listed below: Family History  Problem Relation Age of Onset  . Cancer Mother 48    CERVICAL  . Breast cancer Sister 80    unilateral mastectomy  . Cancer Maternal Aunt     cancer NOS  The patient has no information about her father or paternal side of the family.  Patient's maternal ancestors are of African American descent. There is no reported Ashkenazi Jewish ancestry. There is no known consanguinity.  GENETIC COUNSELING ASSESSMENT: Ebony James is a 53 y.o. female with a personal history of colon cancer and family history of breast and either cervical or ovarian cancer which somewhat suggestive of a hereditary cancer syndrome and predisposition to cancer vs sporadic disease. We, therefore, discussed and recommended the following at today's visit.   DISCUSSION: We reviewed the characteristics, features and inheritance patterns of hereditary cancer syndromes. We also discussed genetic testing, including the  appropriate family members to test, the process of testing, insurance coverage and turn-around-time for results. We discussed that Lynch syndrome is the most common hereditary cancer syndrome.  Her tumor testing suggests that her colon cancer was not related to Lynch syndrome.  There is a small false positive rate with the tumor testing, and that if her mother had ovarian cancer, it would be more concerning that she may fall into that category.  Her sister had breast cancer at 87.  According to Ms. [employer], her sister would need to have triple negative breast cancer with her diagnosis or be diagnosed under 86 in order to  qualify for genetic testing.  Again, if her mother had ovarian cancer rather than cervical cancer this would be more concerning for a hereditary breast cancer syndrome.   Ms. Miron will get more information on her sister's cancer and find out from her mother what type of cancer she had.  She will call me back and update me on her findings.  If she qualifies for testing we will send her to the blood draw lab for genetic testing.  PLAN: After considering the risks, benefits, and limitations, Ebony James will obtain more information about the cancer in her family and will follow up on her mammogram.  If she obtains information that allows her to meet medical criteria for genetic testing we will send her for a blood draw. We discussed the implications of a positive, negative and/ or variant of uncertain significance genetic test result. Results should be available within approximately 3 weeks' time, at which point they will be disclosed by telephone to Ebony James, as will any additional recommendations warranted by these results. Ebony James will receive a summary of her genetic counseling visit and a copy of her results once available. This information will also be available in Epic. We encouraged Ebony James to remain in contact with cancer genetics annually so that we can continuously update the family history and inform her of any changes in cancer genetics and testing that may be of benefit for her family. Ebony James questions were answered to her satisfaction today. Our contact information was provided should additional questions or concerns arise.  The patient was seen for a total of 60 minutes, greater than 50% of which was spent face-to-face counseling.  This note will also be sent to the referring provider via the electronic medical record. The patient will be supplied with a summary of this genetic counseling discussion as well as educational information on the discussed  hereditary cancer syndromes following the conclusion of their visit.   Patient was discussed with Dr. Marcy Panning.   _______________________________________________________________________ For Office Staff:  Number of people involved in session: 1 Was an Intern/ student involved with case: no

## 2013-10-06 ENCOUNTER — Other Ambulatory Visit: Payer: Self-pay | Admitting: Family Medicine

## 2013-10-06 DIAGNOSIS — R1030 Lower abdominal pain, unspecified: Secondary | ICD-10-CM

## 2013-10-06 DIAGNOSIS — R109 Unspecified abdominal pain: Secondary | ICD-10-CM

## 2013-10-13 ENCOUNTER — Ambulatory Visit
Admission: RE | Admit: 2013-10-13 | Discharge: 2013-10-13 | Disposition: A | Discharge status: Home / Self Care | Payer: Managed Care, Other (non HMO) | Source: Ambulatory Visit | Attending: Family Medicine | Admitting: Family Medicine

## 2013-10-13 DIAGNOSIS — R109 Unspecified abdominal pain: Secondary | ICD-10-CM

## 2013-10-13 DIAGNOSIS — R1030 Lower abdominal pain, unspecified: Secondary | ICD-10-CM

## 2013-10-13 MED ORDER — IOHEXOL 300 MG/ML  SOLN
125.0000 mL | Freq: Once | INTRAMUSCULAR | Status: AC | PRN
  Administered 2013-10-13: 125 mL via INTRAVENOUS

## 2013-10-24 ENCOUNTER — Other Ambulatory Visit: Payer: Self-pay | Admitting: Family Medicine

## 2013-10-24 DIAGNOSIS — J929 Pleural plaque without asbestos: Secondary | ICD-10-CM

## 2013-10-31 ENCOUNTER — Inpatient Hospital Stay: Admission: RE | Admit: 2013-10-31 | Payer: Managed Care, Other (non HMO) | Source: Ambulatory Visit

## 2013-11-17 ENCOUNTER — Ambulatory Visit (INDEPENDENT_AMBULATORY_CARE_PROVIDER_SITE_OTHER): Payer: Managed Care, Other (non HMO) | Admitting: General Surgery

## 2013-11-25 ENCOUNTER — Ambulatory Visit (INDEPENDENT_AMBULATORY_CARE_PROVIDER_SITE_OTHER): Payer: Managed Care, Other (non HMO) | Admitting: General Surgery

## 2014-01-27 ENCOUNTER — Other Ambulatory Visit: Payer: Self-pay | Admitting: Family Medicine

## 2014-01-27 DIAGNOSIS — N6001 Solitary cyst of right breast: Secondary | ICD-10-CM

## 2014-02-04 ENCOUNTER — Other Ambulatory Visit: Payer: Self-pay | Admitting: Gastroenterology

## 2014-02-05 ENCOUNTER — Ambulatory Visit
Admission: RE | Admit: 2014-02-05 | Discharge: 2014-02-05 | Disposition: A | Discharge status: Home / Self Care | Payer: Managed Care, Other (non HMO) | Source: Ambulatory Visit | Attending: Family Medicine | Admitting: Family Medicine

## 2014-02-05 DIAGNOSIS — N6001 Solitary cyst of right breast: Secondary | ICD-10-CM

## 2014-02-09 ENCOUNTER — Encounter: Payer: Self-pay | Admitting: Genetic Counselor

## 2014-03-25 ENCOUNTER — Other Ambulatory Visit: Payer: Self-pay | Admitting: Family Medicine

## 2014-03-25 ENCOUNTER — Other Ambulatory Visit: Payer: Self-pay

## 2014-03-25 DIAGNOSIS — N63 Unspecified lump in unspecified breast: Secondary | ICD-10-CM

## 2014-03-30 ENCOUNTER — Ambulatory Visit
Admission: RE | Admit: 2014-03-30 | Discharge: 2014-03-30 | Disposition: A | Discharge status: Home / Self Care | Payer: Managed Care, Other (non HMO) | Source: Ambulatory Visit | Attending: Family Medicine | Admitting: Family Medicine

## 2014-03-30 DIAGNOSIS — J929 Pleural plaque without asbestos: Secondary | ICD-10-CM

## 2014-03-30 MED ORDER — IOHEXOL 300 MG/ML  SOLN
75.0000 mL | Freq: Once | INTRAMUSCULAR | Status: AC | PRN
  Administered 2014-03-30: 75 mL via INTRAVENOUS

## 2014-04-06 ENCOUNTER — Ambulatory Visit
Admission: RE | Admit: 2014-04-06 | Discharge: 2014-04-06 | Disposition: A | Discharge status: Home / Self Care | Payer: Managed Care, Other (non HMO) | Source: Ambulatory Visit | Attending: Family Medicine | Admitting: Family Medicine

## 2014-04-06 DIAGNOSIS — N63 Unspecified lump in unspecified breast: Secondary | ICD-10-CM

## 2015-09-13 ENCOUNTER — Other Ambulatory Visit: Payer: Self-pay | Admitting: Obstetrics and Gynecology

## 2015-09-13 DIAGNOSIS — N6310 Unspecified lump in the right breast, unspecified quadrant: Secondary | ICD-10-CM

## 2015-11-02 ENCOUNTER — Ambulatory Visit
Admission: RE | Admit: 2015-11-02 | Discharge: 2015-11-02 | Disposition: A | Discharge status: Home / Self Care | Payer: BLUE CROSS/BLUE SHIELD | Source: Ambulatory Visit | Attending: Obstetrics and Gynecology | Admitting: Obstetrics and Gynecology

## 2015-11-02 DIAGNOSIS — N6310 Unspecified lump in the right breast, unspecified quadrant: Secondary | ICD-10-CM

## 2016-01-21 IMAGING — CT CT CHEST W/ CM
2 of 3 series · 15 of 36 positions shown, 18 images · IV contrast (omnipaque)
Comparison: Images of the lung bases CT scan 10/13/2013

CLINICAL DATA: Followup nodular pleural thickening CT scan of the
chest

EXAM:
CT CHEST WITH CONTRAST
TECHNIQUE: Multidetector CT imaging of the chest was performed during
intravenous contrast administration.
CONTRAST:  75mL OMNIPAQUE IOHEXOL 300 MG/ML  SOLN

[Series 2: chest with · axial · 0.70mm/px · z∈[-260,-30]mm · 12 of 55 slices shown, 15 images]
[im 5/55  mediastinal]
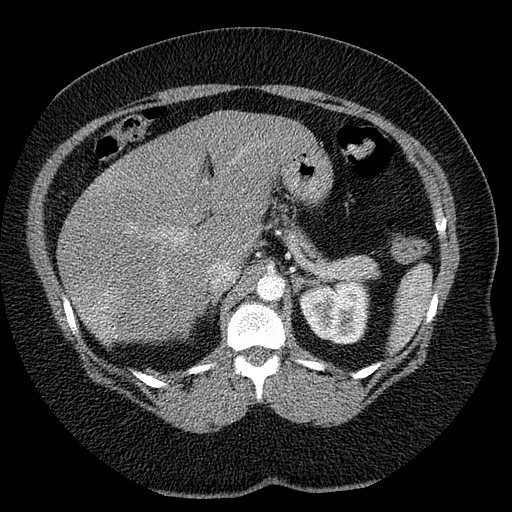
[im 5/55  lung]
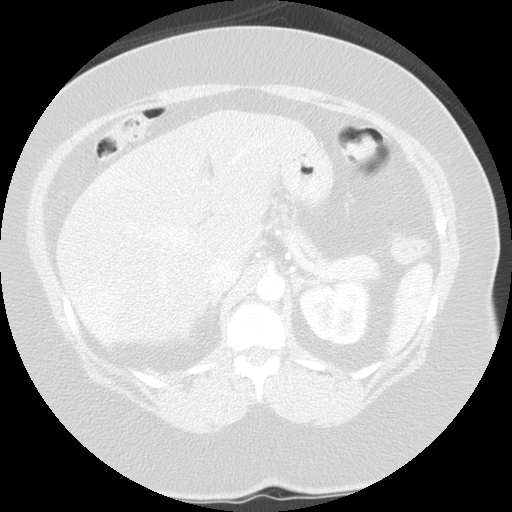
[im 9/55  lung]
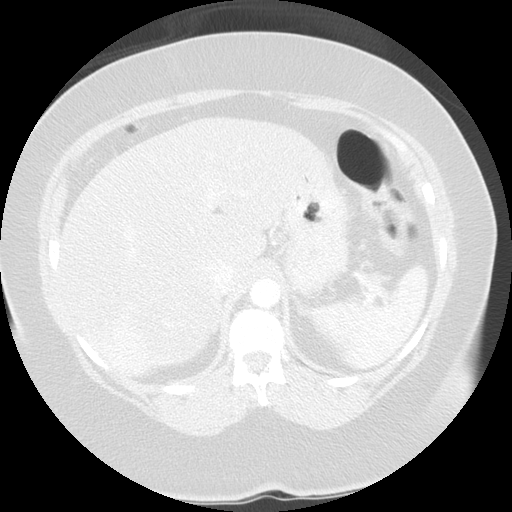
[im 13/55  lung]
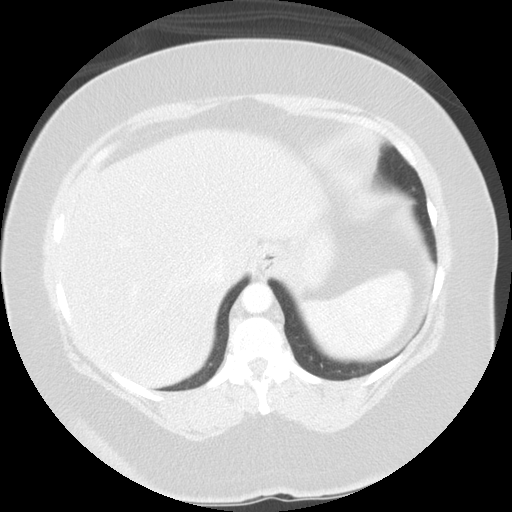
[im 17/55  lung]
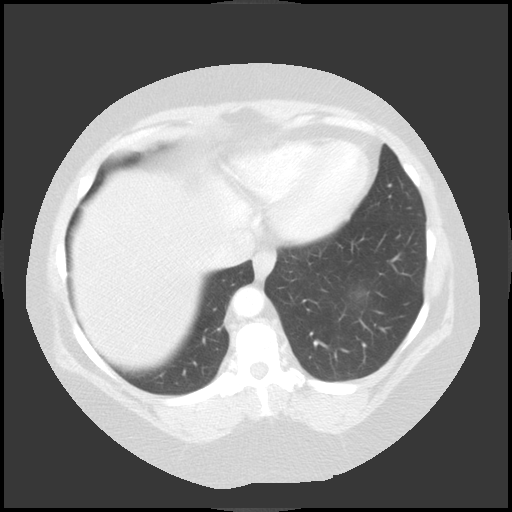
[im 21/55  mediastinal]
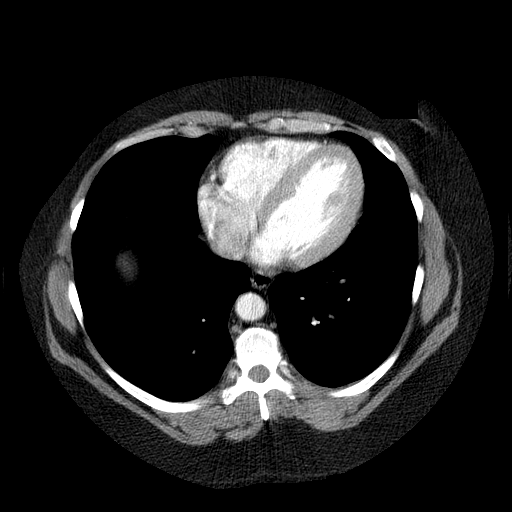
[im 21/55  lung]
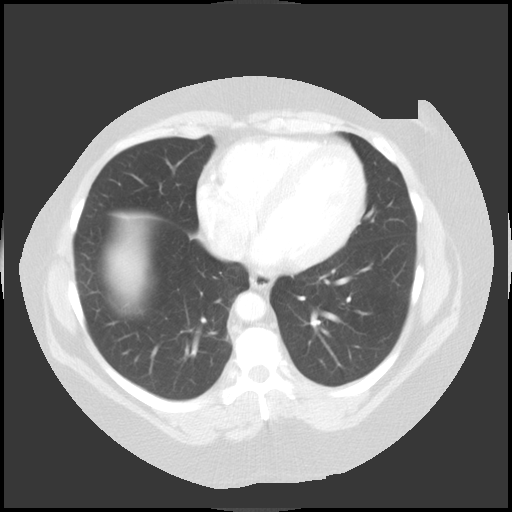
[im 25/55  lung]
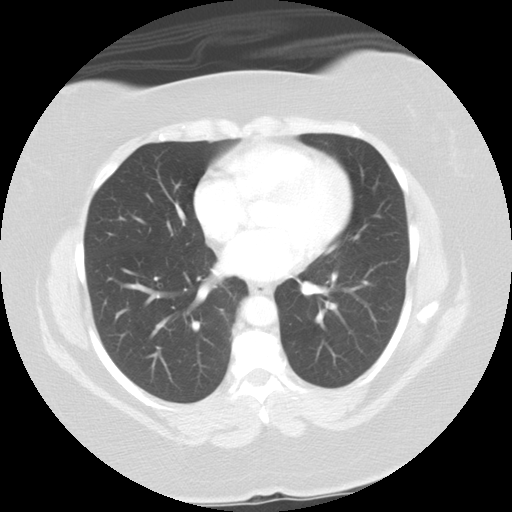
[im 31/55  lung]
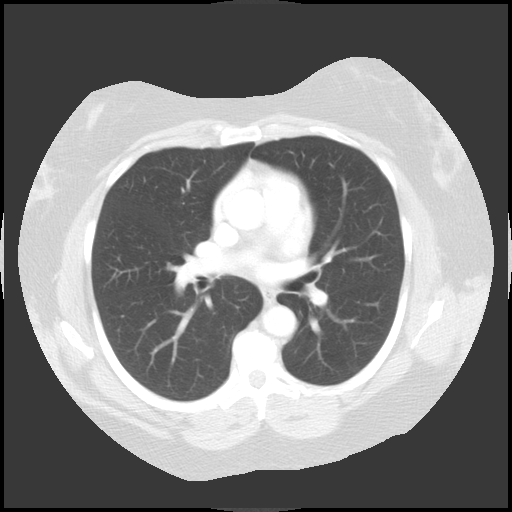
[im 35/55  lung]
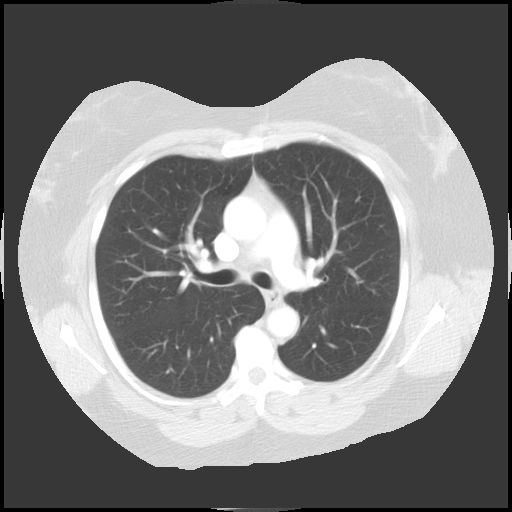
[im 39/55  mediastinal]
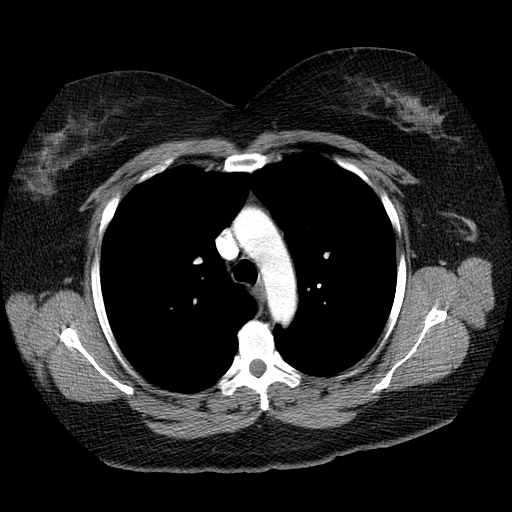
[im 39/55  lung]
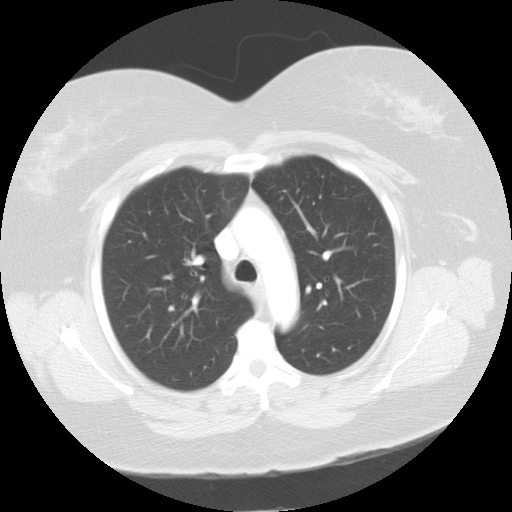
[im 43/55  lung]
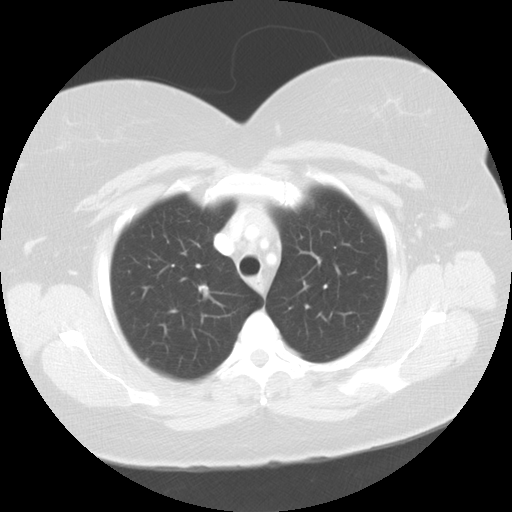
[im 47/55  lung]
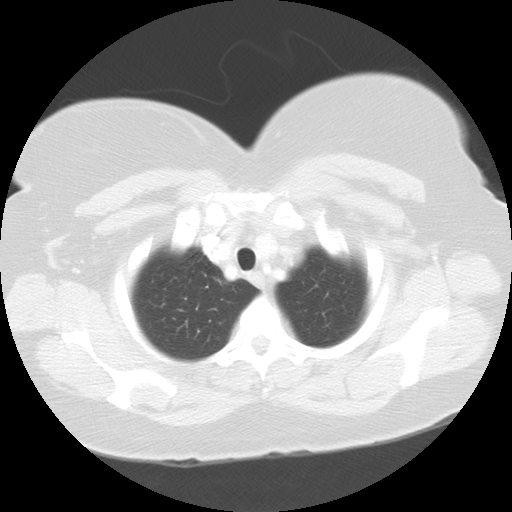
[im 51/55  lung]
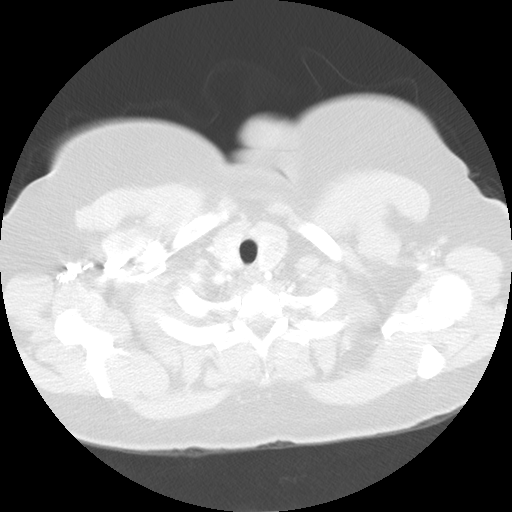

[Series 400: cor · coronal · 0.70mm/px · 3 of 150 slices shown]
[im 30/150  lung]
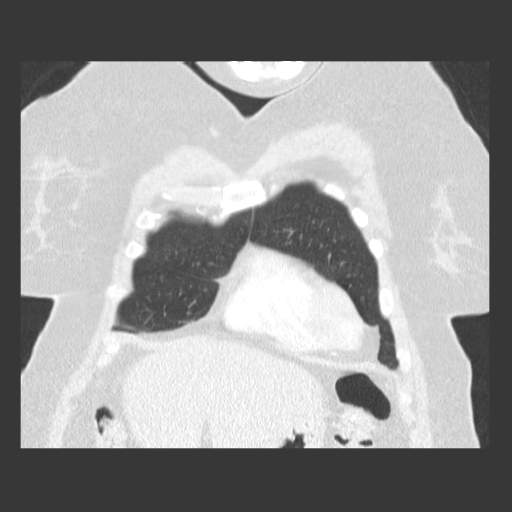
[im 60/150  lung]
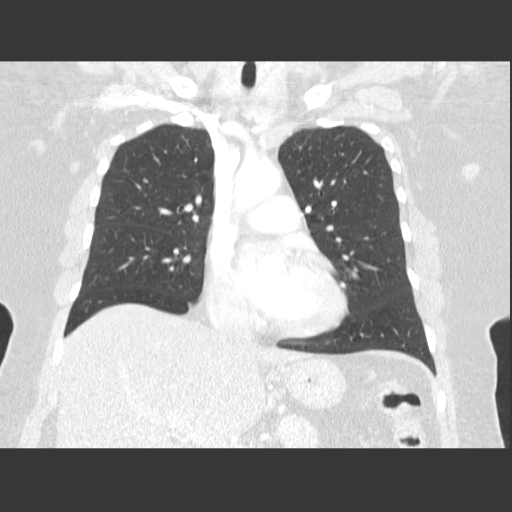
[im 90/150  lung]
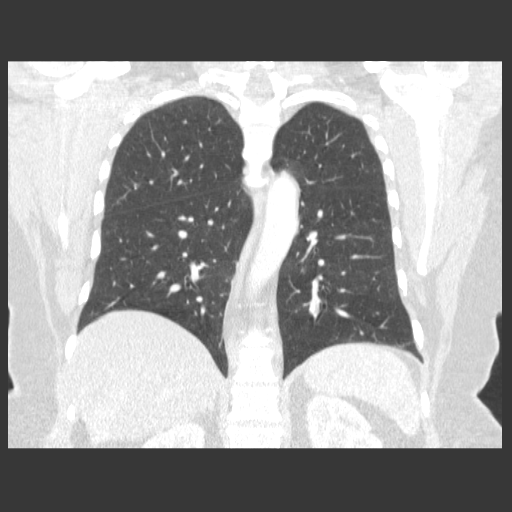

[15 of 36 positions shown; findings below may reference images not displayed]

FINDINGS: Images of the thoracic inlet are unremarkable. Central airways are
patent.

No mediastinal hematoma or adenopathy.

Sagittal images of the spine shows mild degenerative changes mid and
upper thoracic spine. Sagittal view of the sternum is unremarkable.

The visualized upper abdomen shows fatty infiltration of the liver.
No adrenal gland mass is noted.

Images of the lung parenchyma shows no acute infiltrate or pleural
effusion. No focal consolidation. No bronchiectasis. No residual
pleural thickening of nodularity is noted. Tiny hiatal hernia. No
pulmonary nodules are identified.

Heart size within normal limits.  No pericardial effusion.
IMPRESSION: 1. No pleural thickening or pleural nodularity is noted. No acute
infiltrate or pulmonary edema.
2. No mediastinal hematoma or adenopathy.
3. There is fatty infiltration of the liver.
4. Mild degenerative changes thoracic spine.

## 2017-02-27 ENCOUNTER — Other Ambulatory Visit: Payer: Self-pay | Admitting: Obstetrics and Gynecology

## 2017-02-27 DIAGNOSIS — Z1231 Encounter for screening mammogram for malignant neoplasm of breast: Secondary | ICD-10-CM

## 2017-03-29 ENCOUNTER — Ambulatory Visit
Admission: RE | Admit: 2017-03-29 | Discharge: 2017-03-29 | Disposition: A | Discharge status: Home / Self Care | Payer: BLUE CROSS/BLUE SHIELD | Source: Ambulatory Visit | Attending: Obstetrics and Gynecology | Admitting: Obstetrics and Gynecology

## 2017-03-29 DIAGNOSIS — Z1231 Encounter for screening mammogram for malignant neoplasm of breast: Secondary | ICD-10-CM

## 2018-02-01 ENCOUNTER — Other Ambulatory Visit: Payer: Self-pay | Admitting: Family Medicine

## 2018-02-01 DIAGNOSIS — Z1231 Encounter for screening mammogram for malignant neoplasm of breast: Secondary | ICD-10-CM

## 2018-04-01 ENCOUNTER — Ambulatory Visit
Admission: RE | Admit: 2018-04-01 | Discharge: 2018-04-01 | Disposition: A | Discharge status: Home / Self Care | Payer: BLUE CROSS/BLUE SHIELD | Source: Ambulatory Visit | Attending: Family Medicine | Admitting: Family Medicine

## 2018-04-01 DIAGNOSIS — Z1231 Encounter for screening mammogram for malignant neoplasm of breast: Secondary | ICD-10-CM

## 2019-03-17 ENCOUNTER — Other Ambulatory Visit: Payer: Self-pay | Admitting: Family Medicine

## 2019-03-17 DIAGNOSIS — Z1231 Encounter for screening mammogram for malignant neoplasm of breast: Secondary | ICD-10-CM

## 2019-05-06 ENCOUNTER — Other Ambulatory Visit: Payer: Self-pay

## 2019-05-06 ENCOUNTER — Ambulatory Visit
Admission: RE | Admit: 2019-05-06 | Discharge: 2019-05-06 | Disposition: A | Discharge status: Home / Self Care | Payer: BLUE CROSS/BLUE SHIELD | Source: Ambulatory Visit | Attending: Family Medicine | Admitting: Family Medicine

## 2019-05-06 DIAGNOSIS — Z1231 Encounter for screening mammogram for malignant neoplasm of breast: Secondary | ICD-10-CM

## 2019-12-08 ENCOUNTER — Other Ambulatory Visit: Payer: Self-pay | Admitting: Family Medicine

## 2020-05-25 ENCOUNTER — Other Ambulatory Visit: Payer: Self-pay | Admitting: Family Medicine

## 2020-05-25 DIAGNOSIS — Z1231 Encounter for screening mammogram for malignant neoplasm of breast: Secondary | ICD-10-CM

## 2020-05-29 ENCOUNTER — Other Ambulatory Visit: Payer: Self-pay

## 2020-05-29 ENCOUNTER — Ambulatory Visit
Admission: RE | Admit: 2020-05-29 | Discharge: 2020-05-29 | Disposition: A | Discharge status: Home / Self Care | Payer: BC Managed Care – PPO | Source: Ambulatory Visit | Attending: Family Medicine | Admitting: Family Medicine

## 2020-05-29 DIAGNOSIS — Z1231 Encounter for screening mammogram for malignant neoplasm of breast: Secondary | ICD-10-CM

## 2021-05-23 ENCOUNTER — Other Ambulatory Visit: Payer: Self-pay | Admitting: Family Medicine

## 2021-05-23 DIAGNOSIS — Z1231 Encounter for screening mammogram for malignant neoplasm of breast: Secondary | ICD-10-CM

## 2021-05-30 ENCOUNTER — Other Ambulatory Visit: Payer: Self-pay

## 2021-05-30 ENCOUNTER — Ambulatory Visit
Admission: RE | Admit: 2021-05-30 | Discharge: 2021-05-30 | Disposition: A | Discharge status: Home / Self Care | Payer: BC Managed Care – PPO | Source: Ambulatory Visit | Attending: Family Medicine | Admitting: Family Medicine

## 2021-05-30 DIAGNOSIS — Z1231 Encounter for screening mammogram for malignant neoplasm of breast: Secondary | ICD-10-CM

## 2022-01-25 ENCOUNTER — Ambulatory Visit: Payer: BC Managed Care – PPO | Admitting: Podiatry

## 2022-01-25 DIAGNOSIS — B353 Tinea pedis: Secondary | ICD-10-CM

## 2022-01-25 MED ORDER — CLOTRIMAZOLE-BETAMETHASONE 1-0.05 % EX CREA: 1.0000 | TOPICAL_CREAM | Freq: Two times a day (BID) | CUTANEOUS | 2 refills | Status: DC

## 2022-01-25 MED ORDER — TERBINAFINE HCL 250 MG PO TABS: 250.0000 mg | ORAL_TABLET | Freq: Every day | ORAL | 0 refills | Status: DC

## 2022-01-25 NOTE — Progress Notes (Signed)
   Chief Complaint  Patient presents with   [place]    Patient here complaining of bilateral itching for 1 year, she has tried using vicks vapor rub on her feet, patient alsohas discoloration on great toe left foot.    HPI: 61 y.o. female presenting today as a new patient for evaluation of itching and burning sensation with prickly feeling to the bilateral feet left greater than the right.  It also itches and burns in between her toes.  She is concerned for possible athlete's foot  Past Medical History:  Diagnosis Date   Anemia    Colon cancer ([facility])    Fibroids, submucosal    Menorrhagia    Vitamin D deficiency    H/O    Past Surgical History:  Procedure Laterality Date   CESAREAN SECTION  1987   COLON SURGERY     LAPAROSCOPIC RIGHT COLECTOMY Right 01/31/2013   Procedure: LAPAROSCOPIC ASSISTED RIGHT COLECTOMY;  Surgeon: Merrie Roof, MD;  Location: [facility];  Service: General;  Laterality: Right;    No Known Allergies   Physical Exam: General: The patient is alert and oriented x3 in no acute distress.  Dermatology: Skin is warm, dry and supple bilateral lower extremities. Negative for open lesions or macerations.  There is some diffuse hyperkeratosis with peeling of skin along the weightbearing surfaces of the left foot  Neurovascular status intact  Musculoskeletal Exam: No pedal deformities noted  Assessment: 1.  Tinea pedis bilateral left greater than right   Plan of Care:  1. Patient evaluated.  2.  Prescription for Lotrisone cream apply 2 times daily 3 prescription for Lamisil 2 and 50 mg #30 daily 4.  Recommend good foot hygiene and keeping her feet dry 5.  Return to clinic in 6 weeks     Edrick Kins, DPM [facility]  Dr. Edrick Kins, DPM    [address], [place] [address]                Office [phone]  Fax [phone]

## 2022-03-08 ENCOUNTER — Ambulatory Visit (INDEPENDENT_AMBULATORY_CARE_PROVIDER_SITE_OTHER): Payer: BC Managed Care – PPO | Admitting: Podiatry

## 2022-03-08 DIAGNOSIS — B353 Tinea pedis: Secondary | ICD-10-CM

## 2022-03-08 NOTE — Progress Notes (Signed)
   Chief Complaint  Patient presents with   Tinea Pedis    Pt stated that she feels like the medication is helping. She still has some itching but it has improved. No new concerns at this time.    HPI: 61 y.o. female presenting today for follow-up evaluation of itching and burning sensation to the bilateral feet left greater than right.  She says that she has been taking the oral Lamisil every other day and applying the Lotrisone cream daily.  She has noticed significant improvement.  Presenting for further treatment and evaluation  Past Medical History:  Diagnosis Date   Anemia    Colon cancer ([facility])    Fibroids, submucosal    Menorrhagia    Vitamin D deficiency    H/O    Past Surgical History:  Procedure Laterality Date   CESAREAN SECTION  1987   COLON SURGERY     LAPAROSCOPIC RIGHT COLECTOMY Right 01/31/2013   Procedure: LAPAROSCOPIC ASSISTED RIGHT COLECTOMY;  Surgeon: Merrie Roof, MD;  Location: [facility];  Service: General;  Laterality: Right;    No Known Allergies   Physical Exam: General: The patient is alert and oriented x3 in no acute distress.  Dermatology: Skin is warm, dry and supple bilateral lower extremities. Negative for open lesions or macerations.  There is some diffuse hyperkeratosis with peeling of skin along the weightbearing surfaces of the left foot  Neurovascular status intact  Musculoskeletal Exam: No pedal deformities noted  Assessment: 1.  Tinea pedis bilateral left greater than right   Plan of Care:  1. Patient evaluated.  2.  Continue Lotrisone cream application.  Recommend 2 times daily versus once 3.  Continue oral Lamisil until completed 4.  Overall the patient has significant improvement.  Will observe for now.  Return to clinic as needed     Edrick Kins, DPM [facility]  Dr. Edrick Kins, DPM    [address],  [address]                Office [phone]   Fax [phone]

## 2022-03-27 ENCOUNTER — Other Ambulatory Visit: Payer: Self-pay | Admitting: Podiatry

## 2022-04-28 ENCOUNTER — Other Ambulatory Visit: Payer: Self-pay | Admitting: Podiatry

## 2022-05-01 ENCOUNTER — Telehealth: Payer: Self-pay | Admitting: *Deleted

## 2022-05-01 NOTE — Telephone Encounter (Signed)
Patient is calling for a refill of the Lotrisone cream, explained that the medication has already been sent today , verbalized understanding and will check w/ pharmacy.

## 2022-05-23 ENCOUNTER — Other Ambulatory Visit: Payer: Self-pay | Admitting: Family Medicine

## 2022-05-23 DIAGNOSIS — Z1231 Encounter for screening mammogram for malignant neoplasm of breast: Secondary | ICD-10-CM

## 2022-07-05 ENCOUNTER — Ambulatory Visit
Admission: RE | Admit: 2022-07-05 | Discharge: 2022-07-05 | Disposition: A | Discharge status: Home / Self Care | Payer: BC Managed Care – PPO | Source: Ambulatory Visit | Attending: Family Medicine | Admitting: Family Medicine

## 2022-07-05 DIAGNOSIS — Z1231 Encounter for screening mammogram for malignant neoplasm of breast: Secondary | ICD-10-CM

## 2023-06-07 ENCOUNTER — Other Ambulatory Visit: Payer: Self-pay | Admitting: Family Medicine

## 2023-06-07 DIAGNOSIS — Z1231 Encounter for screening mammogram for malignant neoplasm of breast: Secondary | ICD-10-CM

## 2023-07-10 ENCOUNTER — Ambulatory Visit
Admission: RE | Admit: 2023-07-10 | Discharge: 2023-07-10 | Disposition: A | Discharge status: Home / Self Care | Payer: BC Managed Care – PPO | Source: Ambulatory Visit | Attending: Family Medicine | Admitting: Family Medicine

## 2023-07-10 DIAGNOSIS — Z1231 Encounter for screening mammogram for malignant neoplasm of breast: Secondary | ICD-10-CM

## 2024-02-11 ENCOUNTER — Encounter: Payer: Self-pay | Admitting: Podiatry

## 2024-02-11 ENCOUNTER — Ambulatory Visit: Admitting: Podiatry

## 2024-02-11 VITALS — Ht 62.0 in | Wt 218.0 lb

## 2024-02-11 DIAGNOSIS — B351 Tinea unguium: Secondary | ICD-10-CM | POA: Diagnosis not present

## 2024-02-11 MED ORDER — CLOTRIMAZOLE-BETAMETHASONE 1-0.05 % EX CREA: 1.0000 | TOPICAL_CREAM | Freq: Every day | CUTANEOUS | 2 refills | Status: AC

## 2024-02-11 MED ORDER — CICLOPIROX 8 % EX SOLN: Freq: Every day | CUTANEOUS | 2 refills | Status: AC

## 2024-02-11 MED ORDER — TERBINAFINE HCL 250 MG PO TABS: 250.0000 mg | ORAL_TABLET | Freq: Every day | ORAL | 0 refills | Status: AC

## 2024-02-11 NOTE — Progress Notes (Signed)
   Chief Complaint  Patient presents with   Nail Problem    Pt is here due to toenail fungus to the right foot states she has been here for the same issue in 2023 was prescribed a cream that helped tremendously.     Subjective: 63 y.o. female presenting today for evaluation of thick discoloration to the toenails and concern for possible toenail fungus.  She also has a history of intermittent athlete's foot.  Lotrisone  cream in the past has been beneficial  Past Medical History:  Diagnosis Date   Anemia    Colon cancer ([facility])    Fibroids, submucosal    Menorrhagia    Vitamin D  deficiency    H/O    Past Surgical History:  Procedure Laterality Date   CESAREAN SECTION  1987   COLON SURGERY     LAPAROSCOPIC RIGHT COLECTOMY Right 01/31/2013   Procedure: LAPAROSCOPIC ASSISTED RIGHT COLECTOMY;  Surgeon: Deward GORMAN Curvin DOUGLAS, MD;  Location: [facility];  Service: General;  Laterality: Right;    No Known Allergies   B/L toenails 02/11/2024  Objective: Physical Exam General: The patient is alert and oriented x3 in no acute distress.  Dermatology: Hyperkeratotic, discolored, thickened, onychodystrophy noted. Skin is warm, dry and supple bilateral lower extremities. Negative for open lesions or macerations.  Intermittent tinea pedis with itching and burning sensation also noted to the interdigital areas of the toes bilateral.  Vascular: Palpable pedal pulses bilaterally. No edema or erythema noted. Capillary refill within normal limits.  Neurological: Grossly intact via light touch  Musculoskeletal Exam: No pedal deformity noted  Assessment: #1 Onychomycosis of toenails #2 intermittent acute tinea pedis/athlete's foot bilateral  Plan of Care:  -Patient was evaluated. -Today we discussed different treatment options including oral, topical, and laser antifungal treatment modalities.  We discussed their efficacies and side effects.  Patient opts for oral antifungal treatment  modality -Prescription for Lamisil  250 mg #90 daily. Pt denies a history of liver pathology or symptoms.  Patient has routine blood work at [facility] locally.  Unfortunately I am unable to see the results.  She states that she has upcoming hepatic function blood work appointment on 02/29/2024.  Recommend that she waits until after her blood work is drawn before beginning the oral Lamisil .  Discontinue if abnormal -Prescription for Penlac 8% topical solution to apply daily in combination with the oral antifungal medication -Prescription for Lotrisone  cream.  Apply PRN acute intermittent athlete's foot -Return to clinic PRN   Thresa EMERSON Sar, DPM [facility]  Dr. Thresa EMERSON Sar, DPM    [address] [place], [place] [address]                Office [phone]  Fax [phone]

## 2024-05-06 ENCOUNTER — Other Ambulatory Visit: Payer: Self-pay | Admitting: Podiatry

## 2024-05-06 DIAGNOSIS — B351 Tinea unguium: Secondary | ICD-10-CM

## 2024-05-08 NOTE — Telephone Encounter (Signed)
 Spoke to pharmacy aware patient needs labs before refill.
